# Patient Record
Sex: Female | Born: 1935
Health system: Southern US, Community
[De-identification: ages and names within clinical notes are randomized; demographics above are authoritative.]

## PROBLEM LIST (undated history)

## (undated) DIAGNOSIS — F419 Anxiety disorder, unspecified: Secondary | ICD-10-CM

## (undated) DIAGNOSIS — E041 Nontoxic single thyroid nodule: Secondary | ICD-10-CM

## (undated) DIAGNOSIS — E119 Type 2 diabetes mellitus without complications: Secondary | ICD-10-CM

## (undated) DIAGNOSIS — E785 Hyperlipidemia, unspecified: Secondary | ICD-10-CM

## (undated) DIAGNOSIS — D510 Vitamin B12 deficiency anemia due to intrinsic factor deficiency: Secondary | ICD-10-CM

## (undated) DIAGNOSIS — I10 Essential (primary) hypertension: Secondary | ICD-10-CM

## (undated) DIAGNOSIS — F32A Depression, unspecified: Secondary | ICD-10-CM

## (undated) DIAGNOSIS — F329 Major depressive disorder, single episode, unspecified: Secondary | ICD-10-CM

## (undated) DIAGNOSIS — K5790 Diverticulosis of intestine, part unspecified, without perforation or abscess without bleeding: Secondary | ICD-10-CM

## (undated) DIAGNOSIS — Z8619 Personal history of other infectious and parasitic diseases: Secondary | ICD-10-CM

## (undated) HISTORY — PX: JOINT REPLACEMENT: SHX530

## (undated) HISTORY — DX: Nontoxic single thyroid nodule: E04.1

## (undated) HISTORY — PX: HERNIA REPAIR: SHX51

## (undated) HISTORY — DX: Vitamin B12 deficiency anemia due to intrinsic factor deficiency: D51.0

## (undated) HISTORY — PX: OTHER SURGICAL HISTORY: SHX169

## (undated) HISTORY — DX: Major depressive disorder, single episode, unspecified: F32.9

## (undated) HISTORY — PX: CATARACT EXTRACTION: SUR2

## (undated) HISTORY — DX: Hyperlipidemia, unspecified: E78.5

## (undated) HISTORY — DX: Anxiety disorder, unspecified: F41.9

## (undated) HISTORY — DX: Depression, unspecified: F32.A

## (undated) HISTORY — DX: Diverticulosis of intestine, part unspecified, without perforation or abscess without bleeding: K57.90

## (undated) HISTORY — DX: Type 2 diabetes mellitus without complications: E11.9

## (undated) HISTORY — DX: Essential (primary) hypertension: I10

## (undated) HISTORY — PX: EYE SURGERY: SHX253

## (undated) HISTORY — PX: COLECTOMY: SHX59

## (undated) HISTORY — PX: CARPAL TUNNEL RELEASE: SHX101

## (undated) HISTORY — PX: TONSILLECTOMY: SUR1361

## (undated) HISTORY — PX: CHOLECYSTECTOMY: SHX55

---

## 1996-10-12 HISTORY — PX: OTHER SURGICAL HISTORY: SHX169

## 2001-09-27 ENCOUNTER — Encounter: Admission: RE | Admit: 2001-09-27 | Discharge: 2001-09-27 | Payer: Self-pay | Admitting: Unknown Physician Specialty

## 2001-09-27 ENCOUNTER — Encounter: Payer: Self-pay | Admitting: Unknown Physician Specialty

## 2005-07-27 ENCOUNTER — Ambulatory Visit (HOSPITAL_COMMUNITY): Admission: RE | Admit: 2005-07-27 | Discharge: 2005-07-27 | Payer: Self-pay | Admitting: Orthopedic Surgery

## 2005-07-27 ENCOUNTER — Ambulatory Visit (HOSPITAL_BASED_OUTPATIENT_CLINIC_OR_DEPARTMENT_OTHER): Admission: RE | Admit: 2005-07-27 | Discharge: 2005-07-27 | Payer: Self-pay | Admitting: Orthopedic Surgery

## 2006-05-05 ENCOUNTER — Inpatient Hospital Stay (HOSPITAL_COMMUNITY): Admission: RE | Admit: 2006-05-05 | Discharge: 2006-05-09 | Payer: Self-pay | Admitting: Orthopedic Surgery

## 2012-07-29 ENCOUNTER — Encounter: Payer: Self-pay | Admitting: *Deleted

## 2012-07-29 ENCOUNTER — Ambulatory Visit (INDEPENDENT_AMBULATORY_CARE_PROVIDER_SITE_OTHER): Payer: Medicare Other | Admitting: Cardiovascular Disease

## 2012-07-29 ENCOUNTER — Encounter: Payer: Self-pay | Admitting: Cardiovascular Disease

## 2012-07-29 ENCOUNTER — Telehealth: Payer: Self-pay | Admitting: Cardiovascular Disease

## 2012-07-29 VITALS — BP 116/68 | HR 72 | Ht 62.0 in | Wt 191.2 lb

## 2012-07-29 DIAGNOSIS — R079 Chest pain, unspecified: Secondary | ICD-10-CM

## 2012-07-29 DIAGNOSIS — E785 Hyperlipidemia, unspecified: Secondary | ICD-10-CM

## 2012-07-29 DIAGNOSIS — I10 Essential (primary) hypertension: Secondary | ICD-10-CM

## 2012-07-29 LAB — BASIC METABOLIC PANEL
GFR: 49.8 mL/min — ABNORMAL LOW (ref >60.00)
Glucose, Bld: 124 mg/dL — ABNORMAL HIGH (ref 70–99)
Potassium: 3.4 mEq/L — ABNORMAL LOW (ref 3.5–5.1)
Sodium: 134 mEq/L — ABNORMAL LOW (ref 135–145)

## 2012-07-29 LAB — CBC WITH DIFFERENTIAL/PLATELET
Basophils Absolute: 0 10*3/uL (ref 0.0–0.1)
Eosinophils Relative: 6 % — ABNORMAL HIGH (ref 0.0–5.0)
Hemoglobin: 12.6 g/dL (ref 12.0–15.0)
Lymphocytes Relative: 8.3 % — ABNORMAL LOW (ref 12.0–46.0)
Monocytes Relative: 7.4 % (ref 3.0–12.0)
Neutro Abs: 4.4 10*3/uL (ref 1.4–7.7)
RBC: 4.13 Mil/uL (ref 3.87–5.11)
RDW: 13.1 % (ref 11.5–14.6)
WBC: 5.6 10*3/uL (ref 4.5–10.5)

## 2012-07-29 MED ORDER — PREDNISONE 20 MG PO TABS: ORAL_TABLET | ORAL | Status: DC

## 2012-07-29 NOTE — Progress Notes (Signed)
History of Present Illness: 77 yo female with history of DM, HTN, HLD, anxiety, diverticulosis, pernicious anemia, depression, thyroid nodule who is here today for new patient evaluation. She tells me that she was in her normal state of good health until August 2013. She was awakened with central chest pain radiating to her back on August 6th. She was taken to the ED and was given NTG and this helped her pain. The next day, she had a nuclear stress test in [facility] and it was reported as normal. The chest pain resolved but recurred in September 2013. The pain was worsened with deep inspiration. She went back to the ED at [facility]. She had no EKG changes and was sent home. Seen by Dr. Sudie Bailey in primary care 1016/13 for recurrence of chest pain and EKG was unchanged. She was given NTG to use as needed and used this once yesterday.  She was found to have a UTI this week and has been put on Bactrim.   Primary Care Physician: Philemon Kingdom  Past Medical History  Diagnosis Date  . Diabetes mellitus   . HTN (hypertension)   . Hyperlipidemia   . Thyroid nodule   . Anxiety   . Depression   . Diverticulosis   . Pernicious anemia     Past Surgical History  Procedure Date  . Hernia repair     x 2  . Colectomy   . Cataract extraction     bilateral  . Tonsillectomy     Current Outpatient Prescriptions  Medication Sig Dispense Refill  . ALPRAZolam (XANAX) 0.5 MG tablet Take 0.5 mg by mouth as needed.      Marland Kitchen aspirin 81 MG tablet Take 81 mg by mouth daily.      Marland Kitchen atenolol-chlorthalidone (TENORETIC) 50-25 MG per tablet Take 1 tablet by mouth daily.      . metFORMIN (GLUCOPHAGE) 500 MG tablet Take 500 mg by mouth 3 (three) times daily with meals.      . nitroGLYCERIN (NITROSTAT) 0.4 MG SL tablet Place 0.4 mg under the tongue every 5 (five) minutes as needed.      . pravastatin (PRAVACHOL) 10 MG tablet Take 10 mg by mouth daily.      . ranitidine (ZANTAC) 150 MG tablet Take 150 mg by  mouth 2 (two) times daily.      Marland Kitchen sulfamethoxazole-trimethoprim (BACTRIM DS,SEPTRA DS) 800-160 MG per tablet Take 1 tablet by mouth 2 (two) times daily.        Allergies  Allergen Reactions  . Contrast Media (Iodinated Diagnostic Agents)   . Shellfish Allergy     History   Social History  . Marital Status: Married    Spouse Name: N/A    Number of Children: 2  . Years of Education: N/A   Occupational History  . Homemaker    Social History Main Topics  . Smoking status: Never Smoker   . Smokeless tobacco: Not on file  . Alcohol Use: No  . Drug Use: No  . Sexually Active: Not on file   Other Topics Concern  . Not on file   Social History Narrative  . No narrative on file    Family History  Problem Relation Age of Onset  . Cancer Father     prostate    Review of Systems:  As stated in the HPI and otherwise negative.   BP 116/68  Pulse 72  Ht 5\' 2"  (1.575 m)  Wt 191 lb 3.2 oz (86.728  kg)  BMI 34.97 kg/m2  Physical Examination: General: Well developed, well nourished, NAD HEENT: OP clear, mucus membranes moist SKIN: warm, dry. No rashes. Neuro: No focal deficits Musculoskeletal: Muscle strength 5/5 all ext Psychiatric: Mood and affect normal Neck: No JVD, no carotid bruits, no thyromegaly, no lymphadenopathy. Lungs:Clear bilaterally, no wheezes, rhonci, crackles Cardiovascular: Regular rate and rhythm. No murmurs, gallops or rubs. Abdomen:Soft. Bowel sounds present. Non-tender.  Extremities: No lower extremity edema. Pulses are 2 + in the bilateral DP/PT.  EKG: Sinus, 1st degree AV block. ST depression anterolateral leads. T wave inversions lateral, inferior and anterolateral leads. Poor R wave progression.   Assessment and Plan:   1. Chest pain: She has had episodes of chest pain every few weeks over the last 2 months. She has risk factors for CAD including DM, HTN, HLD, obesity and age. She had a normal stress myoview at [facility] in August 2013  but has continued to have episodes of chest pain. I have discussed options at this point including cardiac cath or coronary CT. I think the cardiac cath is indicated to exclude obstructive CAD. Will arrange for 08/03/12 at [facility] in outpatient cath lab. Labs today. Risks and benefits reviewed with pt. Will need pre-treatment for dye allergy.

## 2012-07-29 NOTE — Telephone Encounter (Signed)
Spoke with pt. Her grandson's name is Alean Kromer and he brought her to appt today with Dr. Clifton James. I confirmed fax number below with pt.  Will send note. Pt is being treated for UTI. I told her she could take tylenol for elevated temperature and that she should follow label instructions on how to take.

## 2012-07-29 NOTE — Patient Instructions (Signed)
Your physician recommends that you schedule a follow-up appointment in:  4 weeks.   Your physician has requested that you have a cardiac catheterization. Cardiac catheterization is used to diagnose and/or treat various heart conditions. Doctors may recommend this procedure for a number of different reasons. The most common reason is to evaluate chest pain. Chest pain can be a symptom of coronary artery disease (CAD), and cardiac catheterization can show whether plaque is narrowing or blocking your heart's arteries. This procedure is also used to evaluate the valves, as well as measure the blood flow and oxygen levels in different parts of your heart. For further information please visit [email or web address] Please follow instruction sheet, as given. Scheduled for August 03, 2012 at [facility]

## 2012-07-29 NOTE — Telephone Encounter (Signed)
I called pt to get grandson's name. No answer on number listed. Will try again to reach pt

## 2012-07-29 NOTE — Telephone Encounter (Signed)
New Problem:    Patient called in wanting you to fax an abscence excuse into her grandson's [employer] [phone] for todays visit.

## 2012-07-29 NOTE — Telephone Encounter (Signed)
Pt has a temp of 100.6 what can she take?

## 2012-08-01 ENCOUNTER — Telehealth: Payer: Self-pay | Admitting: Cardiovascular Disease

## 2012-08-01 NOTE — Telephone Encounter (Signed)
Spoke with pt and told her letter would be written today asking school to excuse grandson, Shawnie Nicole, from school on October 23,2013.

## 2012-08-01 NOTE — Telephone Encounter (Signed)
Note faxed.

## 2012-08-01 NOTE — Telephone Encounter (Signed)
New problem:  Need a note for grandson for school for upcoming cath procedure  - fax # [phone].

## 2012-08-03 ENCOUNTER — Inpatient Hospital Stay (HOSPITAL_BASED_OUTPATIENT_CLINIC_OR_DEPARTMENT_OTHER)
Admission: RE | Admit: 2012-08-03 | Discharge: 2012-08-03 | Disposition: A | Discharge status: Home / Self Care | Payer: Medicare Other | Source: Ambulatory Visit | Attending: Cardiovascular Disease | Admitting: Cardiovascular Disease

## 2012-08-03 ENCOUNTER — Encounter (HOSPITAL_BASED_OUTPATIENT_CLINIC_OR_DEPARTMENT_OTHER)
Admission: RE | Disposition: A | Discharge status: Home / Self Care | Payer: Self-pay | Source: Ambulatory Visit | Attending: Cardiovascular Disease

## 2012-08-03 DIAGNOSIS — R079 Chest pain, unspecified: Secondary | ICD-10-CM

## 2012-08-03 DIAGNOSIS — E119 Type 2 diabetes mellitus without complications: Secondary | ICD-10-CM | POA: Insufficient documentation

## 2012-08-03 DIAGNOSIS — R0789 Other chest pain: Secondary | ICD-10-CM | POA: Insufficient documentation

## 2012-08-03 DIAGNOSIS — E785 Hyperlipidemia, unspecified: Secondary | ICD-10-CM | POA: Insufficient documentation

## 2012-08-03 DIAGNOSIS — F411 Generalized anxiety disorder: Secondary | ICD-10-CM | POA: Insufficient documentation

## 2012-08-03 DIAGNOSIS — F3289 Other specified depressive episodes: Secondary | ICD-10-CM | POA: Insufficient documentation

## 2012-08-03 DIAGNOSIS — F329 Major depressive disorder, single episode, unspecified: Secondary | ICD-10-CM | POA: Insufficient documentation

## 2012-08-03 DIAGNOSIS — I10 Essential (primary) hypertension: Secondary | ICD-10-CM | POA: Insufficient documentation

## 2012-08-03 SURGERY — JV LEFT HEART CATHETERIZATION WITH CORONARY ANGIOGRAM
Anesthesia: Moderate Sedation

## 2012-08-03 MED ORDER — SODIUM CHLORIDE 0.9 % IV SOLN
INTRAVENOUS | Status: DC
  Administered 2012-08-03: 09:00:00 via INTRAVENOUS

## 2012-08-03 MED ORDER — SODIUM CHLORIDE 0.9 % IV SOLN: INTRAVENOUS | Status: AC

## 2012-08-03 MED ORDER — ACETAMINOPHEN 325 MG PO TABS: 650.0000 mg | ORAL_TABLET | ORAL | Status: DC | PRN

## 2012-08-03 MED ORDER — ASPIRIN 81 MG PO CHEW
324.0000 mg | CHEWABLE_TABLET | ORAL | Status: AC
  Administered 2012-08-03: 324 mg via ORAL

## 2012-08-03 MED ORDER — SODIUM CHLORIDE 0.9 % IJ SOLN: 3.0000 mL | INTRAMUSCULAR | Status: DC | PRN

## 2012-08-03 MED ORDER — SODIUM CHLORIDE 0.9 % IV SOLN: 250.0000 mL | INTRAVENOUS | Status: DC | PRN

## 2012-08-03 MED ORDER — FAMOTIDINE IN NACL 20-0.9 MG/50ML-% IV SOLN
20.0000 mg | INTRAVENOUS | Status: AC
  Administered 2012-08-03: 20 mg via INTRAVENOUS

## 2012-08-03 MED ORDER — DIPHENHYDRAMINE HCL 50 MG/ML IJ SOLN
25.0000 mg | INTRAMUSCULAR | Status: AC
  Administered 2012-08-03: 25 mg via INTRAVENOUS

## 2012-08-03 MED ORDER — DIAZEPAM 5 MG PO TABS
5.0000 mg | ORAL_TABLET | ORAL | Status: AC
  Administered 2012-08-03: 5 mg via ORAL

## 2012-08-03 MED ORDER — SODIUM CHLORIDE 0.9 % IJ SOLN: 3.0000 mL | Freq: Two times a day (BID) | INTRAMUSCULAR | Status: DC

## 2012-08-03 NOTE — CV Procedure (Signed)
   Cardiac Catheterization Operative Report  CLARITZA JULY [id number] 10/23/201311:01 AM PROCHNAU,CAROLINE, MD  Procedure Performed:  1. Left Heart Catheterization 2. Selective Coronary Angiography 3. Left ventricular angiogram  Operator: Verne Carrow, MD  Indication: Chest pain concerning for unstable angina with multiple cardiac risk factors.                                      Procedure Details: The risks, benefits, complications, treatment options, and expected outcomes were discussed with the patient. The patient and/or family concurred with the proposed plan, giving informed consent. The patient was brought to the cath lab after IV hydration was begun and oral premedication was given. The patient was further sedated with Versed. The right groin was prepped and draped in the usual manner. Using the modified Seldinger access technique, a 4 French sheath was placed in the right femoral artery. Standard diagnostic catheters were used to perform selective coronary angiography. A pigtail catheter was used to perform a left ventricular angiogram.  There were no immediate complications. The patient was taken to the recovery area in stable condition.   Hemodynamic Findings: Central aortic pressure: 130/54 Left ventricular pressure: 130/12/18  Angiographic Findings:  Left main: No obstructive disease.   Left Anterior Descending Artery: Moderate caliber vessel that courses to the apex. No obstructive disease noted. Small caliber diagonal branch.   Circumflex Artery: Moderate caliber vessel with no obstructive disease noted.   Right Coronary Artery: Large dominant vessel with no obstructive disease noted.   Left Ventricular Angiogram: LVEF 55-60%.   Impression: 1. No angiographic evidence of CAD 2. Normal LV systolic function 3. Non-cardiac chest pain  Recommendations: No further cardiac workup.        Complications:  None. The patient tolerated the procedure well.

## 2012-08-03 NOTE — H&P (View-Only) (Signed)
 History of Present Illness: 77 yo female with history of DM, HTN, HLD, anxiety, diverticulosis, pernicious anemia, depression, thyroid nodule who is here today for new patient evaluation. She tells me that she was in her normal state of good health until August 2013. She was awakened with central chest pain radiating to her back on August 6th. She was taken to the ED and was given NTG and this helped her pain. The next day, she had a nuclear stress test in [facility] and it was reported as normal. The chest pain resolved but recurred in September 2013. The pain was worsened with deep inspiration. She went back to the ED at [place]. She had no EKG changes and was sent home. Seen by Dr. Prochnau in primary care 1016/13 for recurrence of chest pain and EKG was unchanged. She was given NTG to use as needed and used this once yesterday.  She was found to have a UTI this week and has been put on Bactrim.   Primary Care Physician: Caroline Prochnau  Past Medical History  Diagnosis Date  . Diabetes mellitus   . HTN (hypertension)   . Hyperlipidemia   . Thyroid nodule   . Anxiety   . Depression   . Diverticulosis   . Pernicious anemia     Past Surgical History  Procedure Date  . Hernia repair     x 2  . Colectomy   . Cataract extraction     bilateral  . Tonsillectomy     Current Outpatient Prescriptions  Medication Sig Dispense Refill  . ALPRAZolam (XANAX) 0.5 MG tablet Take 0.5 mg by mouth as needed.      . aspirin 81 MG tablet Take 81 mg by mouth daily.      . atenolol-chlorthalidone (TENORETIC) 50-25 MG per tablet Take 1 tablet by mouth daily.      . metFORMIN (GLUCOPHAGE) 500 MG tablet Take 500 mg by mouth 3 (three) times daily with meals.      . nitroGLYCERIN (NITROSTAT) 0.4 MG SL tablet Place 0.4 mg under the tongue every 5 (five) minutes as needed.      . pravastatin (PRAVACHOL) 10 MG tablet Take 10 mg by mouth daily.      . ranitidine (ZANTAC) 150 MG tablet Take 150 mg by  mouth 2 (two) times daily.      . sulfamethoxazole-trimethoprim (BACTRIM DS,SEPTRA DS) 800-160 MG per tablet Take 1 tablet by mouth 2 (two) times daily.        Allergies  Allergen Reactions  . Contrast Media (Iodinated Diagnostic Agents)   . Shellfish Allergy     History   Social History  . Marital Status: Married    Spouse Name: N/A    Number of Children: 2  . Years of Education: N/A   Occupational History  . Homemaker    Social History Main Topics  . Smoking status: Never Smoker   . Smokeless tobacco: Not on file  . Alcohol Use: No  . Drug Use: No  . Sexually Active: Not on file   Other Topics Concern  . Not on file   Social History Narrative  . No narrative on file    Family History  Problem Relation Age of Onset  . Cancer Father     prostate    Review of Systems:  As stated in the HPI and otherwise negative.   BP 116/68  Pulse 72  Ht 5' 2" (1.575 m)  Wt 191 lb 3.2 oz (86.728   kg)  BMI 34.97 kg/m2  Physical Examination: General: Well developed, well nourished, NAD HEENT: OP clear, mucus membranes moist SKIN: warm, dry. No rashes. Neuro: No focal deficits Musculoskeletal: Muscle strength 5/5 all ext Psychiatric: Mood and affect normal Neck: No JVD, no carotid bruits, no thyromegaly, no lymphadenopathy. Lungs:Clear bilaterally, no wheezes, rhonci, crackles Cardiovascular: Regular rate and rhythm. No murmurs, gallops or rubs. Abdomen:Soft. Bowel sounds present. Non-tender.  Extremities: No lower extremity edema. Pulses are 2 + in the bilateral DP/PT.  EKG: Sinus, 1st degree AV block. ST depression anterolateral leads. T wave inversions lateral, inferior and anterolateral leads. Poor R wave progression.   Assessment and Plan:   1. Chest pain: She has had episodes of chest pain every few weeks over the last 2 months. She has risk factors for CAD including DM, HTN, HLD, obesity and age. She had a normal stress myoview at [facility] in August 2013  but has continued to have episodes of chest pain. I have discussed options at this point including cardiac cath or coronary CT. I think the cardiac cath is indicated to exclude obstructive CAD. Will arrange for 08/03/12 at [facility] in outpatient cath lab. Labs today. Risks and benefits reviewed with pt. Will need pre-treatment for dye allergy.  

## 2012-08-03 NOTE — Interval H&P Note (Signed)
History and Physical Interval Note:  08/03/2012 10:11 AM  Drucie Ip  has presented today for cardiac cath with the diagnosis of cp.  The various methods of treatment have been discussed with the patient and family. After consideration of risks, benefits and other options for treatment, the patient has consented to  Procedure(s) (LRB) with comments: JV LEFT HEART CATHETERIZATION WITH CORONARY ANGIOGRAM (N/A) as a surgical intervention .  The patient's history has been reviewed, patient examined, no change in status, stable for surgery.  I have reviewed the patient's chart and labs.  Questions were answered to the patient's satisfaction.     Andrea Gordon

## 2012-08-03 NOTE — Progress Notes (Signed)
Bedrest begins @ 1115.  Tegaderm dressing applied by Venda Rodes, site level 0.

## 2012-08-04 ENCOUNTER — Encounter (HOSPITAL_BASED_OUTPATIENT_CLINIC_OR_DEPARTMENT_OTHER): Payer: Self-pay

## 2012-09-02 ENCOUNTER — Ambulatory Visit: Payer: Medicare Other | Admitting: Cardiovascular Disease

## 2014-10-24 DIAGNOSIS — G894 Chronic pain syndrome: Secondary | ICD-10-CM | POA: Diagnosis not present

## 2014-10-24 DIAGNOSIS — M19042 Primary osteoarthritis, left hand: Secondary | ICD-10-CM | POA: Diagnosis not present

## 2014-11-22 DIAGNOSIS — H02823 Cysts of right eye, unspecified eyelid: Secondary | ICD-10-CM | POA: Diagnosis not present

## 2014-11-22 DIAGNOSIS — L570 Actinic keratosis: Secondary | ICD-10-CM | POA: Diagnosis not present

## 2014-12-26 DIAGNOSIS — E785 Hyperlipidemia, unspecified: Secondary | ICD-10-CM | POA: Diagnosis not present

## 2014-12-26 DIAGNOSIS — N390 Urinary tract infection, site not specified: Secondary | ICD-10-CM | POA: Diagnosis not present

## 2014-12-26 DIAGNOSIS — I10 Essential (primary) hypertension: Secondary | ICD-10-CM | POA: Diagnosis not present

## 2014-12-26 DIAGNOSIS — E119 Type 2 diabetes mellitus without complications: Secondary | ICD-10-CM | POA: Diagnosis not present

## 2014-12-26 DIAGNOSIS — Z79899 Other long term (current) drug therapy: Secondary | ICD-10-CM | POA: Diagnosis not present

## 2015-02-26 DIAGNOSIS — M189 Osteoarthritis of first carpometacarpal joint, unspecified: Secondary | ICD-10-CM | POA: Diagnosis not present

## 2015-03-04 DIAGNOSIS — M1812 Unilateral primary osteoarthritis of first carpometacarpal joint, left hand: Secondary | ICD-10-CM | POA: Diagnosis not present

## 2015-03-04 DIAGNOSIS — E785 Hyperlipidemia, unspecified: Secondary | ICD-10-CM | POA: Diagnosis not present

## 2015-03-04 DIAGNOSIS — E119 Type 2 diabetes mellitus without complications: Secondary | ICD-10-CM | POA: Diagnosis not present

## 2015-03-04 DIAGNOSIS — I10 Essential (primary) hypertension: Secondary | ICD-10-CM | POA: Diagnosis not present

## 2015-03-04 DIAGNOSIS — Z79899 Other long term (current) drug therapy: Secondary | ICD-10-CM | POA: Diagnosis not present

## 2015-03-04 DIAGNOSIS — Z7982 Long term (current) use of aspirin: Secondary | ICD-10-CM | POA: Diagnosis not present

## 2015-03-04 DIAGNOSIS — M189 Osteoarthritis of first carpometacarpal joint, unspecified: Secondary | ICD-10-CM | POA: Diagnosis not present

## 2015-03-04 DIAGNOSIS — Z794 Long term (current) use of insulin: Secondary | ICD-10-CM | POA: Diagnosis not present

## 2015-03-19 DIAGNOSIS — M189 Osteoarthritis of first carpometacarpal joint, unspecified: Secondary | ICD-10-CM | POA: Diagnosis not present

## 2015-05-23 DIAGNOSIS — I10 Essential (primary) hypertension: Secondary | ICD-10-CM | POA: Diagnosis not present

## 2015-05-23 DIAGNOSIS — Z79899 Other long term (current) drug therapy: Secondary | ICD-10-CM | POA: Diagnosis not present

## 2015-05-23 DIAGNOSIS — F339 Major depressive disorder, recurrent, unspecified: Secondary | ICD-10-CM | POA: Diagnosis not present

## 2015-05-23 DIAGNOSIS — K219 Gastro-esophageal reflux disease without esophagitis: Secondary | ICD-10-CM | POA: Diagnosis not present

## 2015-05-23 DIAGNOSIS — F419 Anxiety disorder, unspecified: Secondary | ICD-10-CM | POA: Diagnosis not present

## 2015-05-23 DIAGNOSIS — E119 Type 2 diabetes mellitus without complications: Secondary | ICD-10-CM | POA: Diagnosis not present

## 2015-05-23 DIAGNOSIS — E785 Hyperlipidemia, unspecified: Secondary | ICD-10-CM | POA: Diagnosis not present

## 2015-05-29 DIAGNOSIS — M189 Osteoarthritis of first carpometacarpal joint, unspecified: Secondary | ICD-10-CM | POA: Diagnosis not present

## 2015-07-09 DIAGNOSIS — Z794 Long term (current) use of insulin: Secondary | ICD-10-CM | POA: Diagnosis not present

## 2015-07-09 DIAGNOSIS — K625 Hemorrhage of anus and rectum: Secondary | ICD-10-CM | POA: Diagnosis not present

## 2015-07-09 DIAGNOSIS — I10 Essential (primary) hypertension: Secondary | ICD-10-CM | POA: Diagnosis not present

## 2015-07-09 DIAGNOSIS — E785 Hyperlipidemia, unspecified: Secondary | ICD-10-CM | POA: Diagnosis not present

## 2015-07-09 DIAGNOSIS — K219 Gastro-esophageal reflux disease without esophagitis: Secondary | ICD-10-CM | POA: Diagnosis not present

## 2015-07-09 DIAGNOSIS — E1165 Type 2 diabetes mellitus with hyperglycemia: Secondary | ICD-10-CM | POA: Diagnosis not present

## 2015-07-09 DIAGNOSIS — Z79899 Other long term (current) drug therapy: Secondary | ICD-10-CM | POA: Diagnosis not present

## 2015-07-09 DIAGNOSIS — R002 Palpitations: Secondary | ICD-10-CM | POA: Diagnosis not present

## 2015-11-12 DIAGNOSIS — I10 Essential (primary) hypertension: Secondary | ICD-10-CM | POA: Diagnosis not present

## 2015-11-12 DIAGNOSIS — K219 Gastro-esophageal reflux disease without esophagitis: Secondary | ICD-10-CM | POA: Diagnosis not present

## 2015-11-12 DIAGNOSIS — F419 Anxiety disorder, unspecified: Secondary | ICD-10-CM | POA: Diagnosis not present

## 2015-11-12 DIAGNOSIS — R209 Unspecified disturbances of skin sensation: Secondary | ICD-10-CM | POA: Diagnosis not present

## 2015-11-12 DIAGNOSIS — E785 Hyperlipidemia, unspecified: Secondary | ICD-10-CM | POA: Diagnosis not present

## 2015-11-12 DIAGNOSIS — Z79899 Other long term (current) drug therapy: Secondary | ICD-10-CM | POA: Diagnosis not present

## 2015-11-12 DIAGNOSIS — E1165 Type 2 diabetes mellitus with hyperglycemia: Secondary | ICD-10-CM | POA: Diagnosis not present

## 2015-11-22 DIAGNOSIS — G5600 Carpal tunnel syndrome, unspecified upper limb: Secondary | ICD-10-CM | POA: Diagnosis not present

## 2015-12-19 DIAGNOSIS — G5603 Carpal tunnel syndrome, bilateral upper limbs: Secondary | ICD-10-CM | POA: Diagnosis not present

## 2015-12-20 DIAGNOSIS — G5600 Carpal tunnel syndrome, unspecified upper limb: Secondary | ICD-10-CM | POA: Diagnosis not present

## 2016-01-02 DIAGNOSIS — I10 Essential (primary) hypertension: Secondary | ICD-10-CM | POA: Diagnosis not present

## 2016-01-02 DIAGNOSIS — E785 Hyperlipidemia, unspecified: Secondary | ICD-10-CM | POA: Diagnosis not present

## 2016-01-02 DIAGNOSIS — G5601 Carpal tunnel syndrome, right upper limb: Secondary | ICD-10-CM | POA: Diagnosis not present

## 2016-01-02 DIAGNOSIS — K219 Gastro-esophageal reflux disease without esophagitis: Secondary | ICD-10-CM | POA: Diagnosis not present

## 2016-01-02 DIAGNOSIS — Z79899 Other long term (current) drug therapy: Secondary | ICD-10-CM | POA: Diagnosis not present

## 2016-01-02 DIAGNOSIS — F329 Major depressive disorder, single episode, unspecified: Secondary | ICD-10-CM | POA: Diagnosis not present

## 2016-01-02 DIAGNOSIS — F419 Anxiety disorder, unspecified: Secondary | ICD-10-CM | POA: Diagnosis not present

## 2016-01-02 DIAGNOSIS — G5600 Carpal tunnel syndrome, unspecified upper limb: Secondary | ICD-10-CM | POA: Diagnosis not present

## 2016-03-01 DIAGNOSIS — R079 Chest pain, unspecified: Secondary | ICD-10-CM | POA: Diagnosis not present

## 2016-03-01 DIAGNOSIS — R05 Cough: Secondary | ICD-10-CM | POA: Diagnosis not present

## 2016-03-12 DIAGNOSIS — E785 Hyperlipidemia, unspecified: Secondary | ICD-10-CM | POA: Diagnosis not present

## 2016-03-12 DIAGNOSIS — R5383 Other fatigue: Secondary | ICD-10-CM | POA: Diagnosis not present

## 2016-03-12 DIAGNOSIS — Z79899 Other long term (current) drug therapy: Secondary | ICD-10-CM | POA: Diagnosis not present

## 2016-03-12 DIAGNOSIS — F419 Anxiety disorder, unspecified: Secondary | ICD-10-CM | POA: Diagnosis not present

## 2016-03-12 DIAGNOSIS — K219 Gastro-esophageal reflux disease without esophagitis: Secondary | ICD-10-CM | POA: Diagnosis not present

## 2016-03-12 DIAGNOSIS — E119 Type 2 diabetes mellitus without complications: Secondary | ICD-10-CM | POA: Diagnosis not present

## 2016-03-12 DIAGNOSIS — I10 Essential (primary) hypertension: Secondary | ICD-10-CM | POA: Diagnosis not present

## 2016-04-22 DIAGNOSIS — G5600 Carpal tunnel syndrome, unspecified upper limb: Secondary | ICD-10-CM | POA: Diagnosis not present

## 2016-06-24 DIAGNOSIS — I10 Essential (primary) hypertension: Secondary | ICD-10-CM | POA: Diagnosis not present

## 2016-06-24 DIAGNOSIS — Z79899 Other long term (current) drug therapy: Secondary | ICD-10-CM | POA: Diagnosis not present

## 2016-06-24 DIAGNOSIS — E559 Vitamin D deficiency, unspecified: Secondary | ICD-10-CM | POA: Diagnosis not present

## 2016-07-22 DIAGNOSIS — F419 Anxiety disorder, unspecified: Secondary | ICD-10-CM | POA: Diagnosis not present

## 2016-07-22 DIAGNOSIS — E119 Type 2 diabetes mellitus without complications: Secondary | ICD-10-CM | POA: Diagnosis not present

## 2016-07-22 DIAGNOSIS — L659 Nonscarring hair loss, unspecified: Secondary | ICD-10-CM | POA: Diagnosis not present

## 2016-07-22 DIAGNOSIS — Z79899 Other long term (current) drug therapy: Secondary | ICD-10-CM | POA: Diagnosis not present

## 2016-07-22 DIAGNOSIS — K219 Gastro-esophageal reflux disease without esophagitis: Secondary | ICD-10-CM | POA: Diagnosis not present

## 2016-07-22 DIAGNOSIS — I10 Essential (primary) hypertension: Secondary | ICD-10-CM | POA: Diagnosis not present

## 2016-07-22 DIAGNOSIS — E785 Hyperlipidemia, unspecified: Secondary | ICD-10-CM | POA: Diagnosis not present

## 2016-10-23 DIAGNOSIS — R509 Fever, unspecified: Secondary | ICD-10-CM | POA: Diagnosis not present

## 2016-10-23 DIAGNOSIS — J111 Influenza due to unidentified influenza virus with other respiratory manifestations: Secondary | ICD-10-CM | POA: Diagnosis not present

## 2016-11-25 DIAGNOSIS — R05 Cough: Secondary | ICD-10-CM | POA: Diagnosis not present

## 2016-11-25 DIAGNOSIS — E119 Type 2 diabetes mellitus without complications: Secondary | ICD-10-CM | POA: Diagnosis not present

## 2016-11-25 DIAGNOSIS — E559 Vitamin D deficiency, unspecified: Secondary | ICD-10-CM | POA: Diagnosis not present

## 2016-11-25 DIAGNOSIS — K219 Gastro-esophageal reflux disease without esophagitis: Secondary | ICD-10-CM | POA: Diagnosis not present

## 2016-11-25 DIAGNOSIS — F419 Anxiety disorder, unspecified: Secondary | ICD-10-CM | POA: Diagnosis not present

## 2016-11-25 DIAGNOSIS — I10 Essential (primary) hypertension: Secondary | ICD-10-CM | POA: Diagnosis not present

## 2016-11-25 DIAGNOSIS — E785 Hyperlipidemia, unspecified: Secondary | ICD-10-CM | POA: Diagnosis not present

## 2016-11-25 DIAGNOSIS — Z79899 Other long term (current) drug therapy: Secondary | ICD-10-CM | POA: Diagnosis not present

## 2016-11-25 DIAGNOSIS — E049 Nontoxic goiter, unspecified: Secondary | ICD-10-CM | POA: Diagnosis not present

## 2017-03-24 DIAGNOSIS — R1032 Left lower quadrant pain: Secondary | ICD-10-CM | POA: Diagnosis not present

## 2017-03-24 DIAGNOSIS — Z79899 Other long term (current) drug therapy: Secondary | ICD-10-CM | POA: Diagnosis not present

## 2017-03-24 DIAGNOSIS — E785 Hyperlipidemia, unspecified: Secondary | ICD-10-CM | POA: Diagnosis not present

## 2017-03-24 DIAGNOSIS — E049 Nontoxic goiter, unspecified: Secondary | ICD-10-CM | POA: Diagnosis not present

## 2017-03-24 DIAGNOSIS — I10 Essential (primary) hypertension: Secondary | ICD-10-CM | POA: Diagnosis not present

## 2017-03-24 DIAGNOSIS — E559 Vitamin D deficiency, unspecified: Secondary | ICD-10-CM | POA: Diagnosis not present

## 2017-03-24 DIAGNOSIS — E119 Type 2 diabetes mellitus without complications: Secondary | ICD-10-CM | POA: Diagnosis not present

## 2017-05-07 DIAGNOSIS — E049 Nontoxic goiter, unspecified: Secondary | ICD-10-CM | POA: Diagnosis not present

## 2017-05-07 DIAGNOSIS — E042 Nontoxic multinodular goiter: Secondary | ICD-10-CM | POA: Diagnosis not present

## 2017-07-08 DIAGNOSIS — R51 Headache: Secondary | ICD-10-CM | POA: Diagnosis not present

## 2017-07-08 DIAGNOSIS — Z79899 Other long term (current) drug therapy: Secondary | ICD-10-CM | POA: Diagnosis not present

## 2017-07-08 DIAGNOSIS — I10 Essential (primary) hypertension: Secondary | ICD-10-CM | POA: Diagnosis not present

## 2017-07-08 DIAGNOSIS — E785 Hyperlipidemia, unspecified: Secondary | ICD-10-CM | POA: Diagnosis not present

## 2017-07-08 DIAGNOSIS — Z794 Long term (current) use of insulin: Secondary | ICD-10-CM | POA: Diagnosis not present

## 2017-07-08 DIAGNOSIS — M549 Dorsalgia, unspecified: Secondary | ICD-10-CM | POA: Diagnosis not present

## 2017-07-08 DIAGNOSIS — M542 Cervicalgia: Secondary | ICD-10-CM | POA: Diagnosis not present

## 2017-07-08 DIAGNOSIS — E119 Type 2 diabetes mellitus without complications: Secondary | ICD-10-CM | POA: Diagnosis not present

## 2017-08-30 DIAGNOSIS — R531 Weakness: Secondary | ICD-10-CM | POA: Diagnosis not present

## 2017-08-30 DIAGNOSIS — M542 Cervicalgia: Secondary | ICD-10-CM | POA: Diagnosis not present

## 2017-08-30 DIAGNOSIS — R200 Anesthesia of skin: Secondary | ICD-10-CM | POA: Diagnosis not present

## 2017-08-30 DIAGNOSIS — M1990 Unspecified osteoarthritis, unspecified site: Secondary | ICD-10-CM | POA: Diagnosis not present

## 2017-08-30 DIAGNOSIS — R51 Headache: Secondary | ICD-10-CM | POA: Diagnosis not present

## 2017-08-30 DIAGNOSIS — R296 Repeated falls: Secondary | ICD-10-CM | POA: Diagnosis not present

## 2017-10-13 DIAGNOSIS — M79641 Pain in right hand: Secondary | ICD-10-CM | POA: Diagnosis not present

## 2017-10-20 DIAGNOSIS — M5412 Radiculopathy, cervical region: Secondary | ICD-10-CM | POA: Diagnosis not present

## 2017-11-08 DIAGNOSIS — M5412 Radiculopathy, cervical region: Secondary | ICD-10-CM | POA: Diagnosis not present

## 2017-11-08 DIAGNOSIS — M542 Cervicalgia: Secondary | ICD-10-CM | POA: Diagnosis not present

## 2017-11-10 DIAGNOSIS — G5600 Carpal tunnel syndrome, unspecified upper limb: Secondary | ICD-10-CM | POA: Diagnosis not present

## 2017-11-10 DIAGNOSIS — G5601 Carpal tunnel syndrome, right upper limb: Secondary | ICD-10-CM | POA: Diagnosis not present

## 2017-11-10 DIAGNOSIS — M5412 Radiculopathy, cervical region: Secondary | ICD-10-CM | POA: Diagnosis not present

## 2017-11-19 DIAGNOSIS — M50123 Cervical disc disorder at C6-C7 level with radiculopathy: Secondary | ICD-10-CM | POA: Diagnosis not present

## 2017-11-19 DIAGNOSIS — M5412 Radiculopathy, cervical region: Secondary | ICD-10-CM | POA: Diagnosis not present

## 2017-11-19 DIAGNOSIS — M50323 Other cervical disc degeneration at C6-C7 level: Secondary | ICD-10-CM | POA: Diagnosis not present

## 2017-11-24 DIAGNOSIS — E119 Type 2 diabetes mellitus without complications: Secondary | ICD-10-CM | POA: Diagnosis not present

## 2017-11-24 DIAGNOSIS — E785 Hyperlipidemia, unspecified: Secondary | ICD-10-CM | POA: Diagnosis not present

## 2017-11-24 DIAGNOSIS — F419 Anxiety disorder, unspecified: Secondary | ICD-10-CM | POA: Diagnosis not present

## 2017-11-24 DIAGNOSIS — I10 Essential (primary) hypertension: Secondary | ICD-10-CM | POA: Diagnosis not present

## 2017-11-24 DIAGNOSIS — Z79899 Other long term (current) drug therapy: Secondary | ICD-10-CM | POA: Diagnosis not present

## 2017-11-24 DIAGNOSIS — Z794 Long term (current) use of insulin: Secondary | ICD-10-CM | POA: Diagnosis not present

## 2017-11-24 DIAGNOSIS — M4802 Spinal stenosis, cervical region: Secondary | ICD-10-CM | POA: Diagnosis not present

## 2017-12-07 DIAGNOSIS — Z6841 Body Mass Index (BMI) 40.0 and over, adult: Secondary | ICD-10-CM | POA: Diagnosis not present

## 2017-12-07 DIAGNOSIS — M4712 Other spondylosis with myelopathy, cervical region: Secondary | ICD-10-CM | POA: Diagnosis not present

## 2017-12-07 DIAGNOSIS — I10 Essential (primary) hypertension: Secondary | ICD-10-CM | POA: Diagnosis not present

## 2017-12-13 ENCOUNTER — Other Ambulatory Visit: Payer: Self-pay | Admitting: Neurosurgery

## 2017-12-31 ENCOUNTER — Encounter (HOSPITAL_COMMUNITY): Payer: Self-pay

## 2017-12-31 NOTE — Pre-Procedure Instructions (Addendum)
Andrea Gordon  12/31/2017      [facility] - [place], [place] - [address] [place] [address] Phone: [phone] Fax: [phone]  [facility] - [place], [place] - [address] [place] [address] Phone: [phone] Fax: [phone]    Your procedure is scheduled on 01-06-2018 Thursday   Report to [facility] Admitting at 12:25  PM  .  Call this number if you have problems the morning of surgery:  [phone]   Remember:  Do not eat food or drink liquids after midnight.   Take these medicines the morning of surgery with A SIP OF WATER   Tylenol if needed Atenolol-chlorthalidone(Tenoretic) Duloxetine(Cymbalta) Gabapentin(Neurontin) Potassium Chloride(K-Dur) Ranitidine(Zantac)   STOP TAKING ANY ASPIRIN(UNLESS OTHERWISE INSTRUCTED BY YOUR SURGEON),ANTIINFLAMATORIES (IBUPROFEN,ALEVE,MOTRIN,ADVIL,GOODY'S POWDERS),HERBAL SUPPLEMENTS,FISH OIL,AND VITAMINS 5-7 DAYS PRIOR TO SURGERY    Special Instructions: [facility] - Preparing for Surgery  Before surgery, you can play an important role.  Because skin is not sterile, your skin needs to be as free of germs as possible.  You can reduce the number of germs on you skin by washing with CHG (chlorahexidine gluconate) soap before surgery.  CHG is an antiseptic cleaner which kills germs and bonds with the skin to continue killing germs even after washing.  Please DO NOT use if you have an allergy to CHG or antibacterial soaps.  If your skin becomes reddened/irritated stop using the CHG and inform your nurse when you arrive at Short Stay.  Do not shave (including legs and underarms) for at least 48 hours prior to the first CHG shower.  You may shave your face.  Please follow these instructions carefully:   1.  Shower with CHG Soap the night before surgery and the   morning of Surgery.  2.  If you choose to wash your hair, wash your  hair first as usual with your normal shampoo.  3.  After you shampoo, rinse your hair and body thoroughly to remove the  Shampoo.  4.  Use CHG as you would any other liquid soap.  You can apply chg directly  to the skin and wash gently with scrungie or a clean washcloth.  5.  Apply the CHG Soap to your body ONLY FROM THE NECK DOWN.   Do not use on open wounds or open sores.  Avoid contact with your eyes,  ears, mouth and genitals (private parts).  Wash genitals (private parts) with your normal soap.  6.  Wash thoroughly, paying special attention to the area where your surgery will be performed.  7.  Thoroughly rinse your body with warm water from the neck down.  8.  DO NOT shower/wash with your normal soap after using and rinsing o  the CHG Soap.  9.  Pat yourself dry with a clean towel.            10.  Wear clean pajamas.            11.  Place clean sheets on your bed the night of your first shower and do not sleep with pets.  Day of Surgery  Do not apply any lotions/deodorants the morning of surgery.  Please wear clean clothes to the hospital/surgery center.      How to Manage Your Diabetes Before and After Surgery  Why is it important to control my blood sugar before and after surgery? . Improving blood sugar levels before and  after surgery helps healing and can limit problems. . A way of improving blood sugar control is eating a healthy diet by: o  Eating less sugar and carbohydrates o  Increasing activity/exercise o  Talking with your doctor about reaching your blood sugar goals . High blood sugars (greater than 180 mg/dL) can raise your risk of infections and slow your recovery, so you will need to focus on controlling your diabetes during the weeks before surgery. . Make sure that the doctor who takes care of your diabetes knows about your planned surgery including the date and location.  How do I manage my blood sugar before surgery? . Check your blood sugar at least 4 times a  day, starting 2 days before surgery, to make sure that the level is not too high or low. o Check your blood sugar the morning of your surgery when you wake up and every 2 hours until you get to the Short Stay unit. . If your blood sugar is less than 70 mg/dL, you will need to treat for low blood sugar: o Do not take insulin. o Treat a low blood sugar (less than 70 mg/dL) with  cup of clear juice (cranberry or apple), 4 glucose tablets, OR glucose gel. Recheck blood sugar in 15 minutes after treatment (to make sure it is greater than 70 mg/dL). If your blood sugar is not greater than 70 mg/dL on recheck, call [phone] o  for further instructions. . Report your blood sugar to the short stay nurse when you get to Short Stay.  . If you are admitted to the hospital after surgery: o Your blood sugar will be checked by the staff and you will probably be given insulin after surgery (instead of oral diabetes medicines) to make sure you have good blood sugar levels. o The goal for blood sugar control after surgery is 80-180 mg/dL.              WHAT DO I DO ABOUT MY DIABETES MEDICATION?   Marland Kitchen. Do not take oral diabetes medicines (pills) the morning of surgery.  . THE NIGHT BEFORE SURGERY, take ___21 units of _70/30 insulin.       . THE MORNING OF SURGERY, take __0 units of _70/30 insulin  . The day of surgery, do not take other diabetes injectables, including Byetta (exenatide), Bydureon (exenatide ER), Victoza (liraglutide), or Trulicity (dulaglutide).  .  .  Other Instructions:           :    :   Reviewed and Endorsed by [facility] Patient Education Committee, August 2015    Do not wear jewelry, make-up or nail polish.  Do not wear lotions, powders, or perfumes, or deodorant.  Do not shave 48 hours prior to surgery.  Men may shave face and neck.   Do not bring valuables to the hospital.   [facility] is not responsible for any belongings or valuables.  Contacts,  dentures or bridgework may not be worn into surgery.  Leave your suitcase in the car.  After surgery it may be brought to your room.  For patients admitted to the hospital, discharge time will be determined by your treatment team.  Patients discharged the day of surgery will not be allowed to drive home.      Please read over the following fact sheets that you were given. Pain Booklet and Surgical Site Infection Prevention

## 2018-01-03 ENCOUNTER — Encounter (HOSPITAL_COMMUNITY)
Admission: RE | Admit: 2018-01-03 | Discharge: 2018-01-03 | Disposition: A | Discharge status: Home / Self Care | Payer: Medicare HMO | Source: Ambulatory Visit | Attending: Neurosurgery | Admitting: Neurosurgery

## 2018-01-03 ENCOUNTER — Other Ambulatory Visit: Payer: Self-pay

## 2018-01-03 ENCOUNTER — Encounter (HOSPITAL_COMMUNITY): Payer: Self-pay | Admitting: *Deleted

## 2018-01-03 DIAGNOSIS — Z7982 Long term (current) use of aspirin: Secondary | ICD-10-CM | POA: Diagnosis not present

## 2018-01-03 DIAGNOSIS — Z01810 Encounter for preprocedural cardiovascular examination: Secondary | ICD-10-CM

## 2018-01-03 DIAGNOSIS — E119 Type 2 diabetes mellitus without complications: Secondary | ICD-10-CM | POA: Diagnosis present

## 2018-01-03 DIAGNOSIS — Z79899 Other long term (current) drug therapy: Secondary | ICD-10-CM

## 2018-01-03 DIAGNOSIS — E785 Hyperlipidemia, unspecified: Secondary | ICD-10-CM | POA: Diagnosis present

## 2018-01-03 DIAGNOSIS — Z91041 Radiographic dye allergy status: Secondary | ICD-10-CM | POA: Diagnosis not present

## 2018-01-03 DIAGNOSIS — Z794 Long term (current) use of insulin: Secondary | ICD-10-CM | POA: Diagnosis not present

## 2018-01-03 DIAGNOSIS — F419 Anxiety disorder, unspecified: Secondary | ICD-10-CM | POA: Diagnosis present

## 2018-01-03 DIAGNOSIS — F418 Other specified anxiety disorders: Secondary | ICD-10-CM | POA: Diagnosis not present

## 2018-01-03 DIAGNOSIS — M4722 Other spondylosis with radiculopathy, cervical region: Secondary | ICD-10-CM | POA: Diagnosis not present

## 2018-01-03 DIAGNOSIS — Z885 Allergy status to narcotic agent status: Secondary | ICD-10-CM | POA: Diagnosis not present

## 2018-01-03 DIAGNOSIS — Z981 Arthrodesis status: Secondary | ICD-10-CM | POA: Diagnosis not present

## 2018-01-03 DIAGNOSIS — K219 Gastro-esophageal reflux disease without esophagitis: Secondary | ICD-10-CM | POA: Diagnosis present

## 2018-01-03 DIAGNOSIS — Z01818 Encounter for other preprocedural examination: Secondary | ICD-10-CM | POA: Insufficient documentation

## 2018-01-03 DIAGNOSIS — I440 Atrioventricular block, first degree: Secondary | ICD-10-CM

## 2018-01-03 DIAGNOSIS — Z888 Allergy status to other drugs, medicaments and biological substances status: Secondary | ICD-10-CM | POA: Diagnosis not present

## 2018-01-03 DIAGNOSIS — M4802 Spinal stenosis, cervical region: Secondary | ICD-10-CM | POA: Diagnosis present

## 2018-01-03 DIAGNOSIS — I10 Essential (primary) hypertension: Secondary | ICD-10-CM | POA: Diagnosis present

## 2018-01-03 DIAGNOSIS — M4712 Other spondylosis with myelopathy, cervical region: Secondary | ICD-10-CM | POA: Diagnosis present

## 2018-01-03 DIAGNOSIS — M50121 Cervical disc disorder at C4-C5 level with radiculopathy: Secondary | ICD-10-CM | POA: Diagnosis present

## 2018-01-03 DIAGNOSIS — F329 Major depressive disorder, single episode, unspecified: Secondary | ICD-10-CM

## 2018-01-03 DIAGNOSIS — Z91013 Allergy to seafood: Secondary | ICD-10-CM | POA: Diagnosis not present

## 2018-01-03 DIAGNOSIS — M5011 Cervical disc disorder with radiculopathy,  high cervical region: Secondary | ICD-10-CM | POA: Diagnosis not present

## 2018-01-03 DIAGNOSIS — M5001 Cervical disc disorder with myelopathy,  high cervical region: Secondary | ICD-10-CM | POA: Diagnosis not present

## 2018-01-03 DIAGNOSIS — Z8619 Personal history of other infectious and parasitic diseases: Secondary | ICD-10-CM | POA: Diagnosis not present

## 2018-01-03 HISTORY — DX: Personal history of other infectious and parasitic diseases: Z86.19

## 2018-01-03 LAB — GLUCOSE, CAPILLARY: Glucose-Capillary: 77 mg/dL (ref 65–99)

## 2018-01-03 LAB — TYPE AND SCREEN
ABO/RH(D): A POS
ANTIBODY SCREEN: NEGATIVE

## 2018-01-03 LAB — BASIC METABOLIC PANEL
ANION GAP: 9 (ref 5–15)
BUN: 21 mg/dL — ABNORMAL HIGH (ref 6–20)
CHLORIDE: 101 mmol/L (ref 101–111)
CO2: 28 mmol/L (ref 22–32)
CREATININE: 0.84 mg/dL (ref 0.44–1.00)
Calcium: 10.4 mg/dL — ABNORMAL HIGH (ref 8.9–10.3)
GFR calc non Af Amer: >60 mL/min (ref >60)
Glucose, Bld: 63 mg/dL — ABNORMAL LOW (ref 65–99)
POTASSIUM: 3.1 mmol/L — AB (ref 3.5–5.1)
SODIUM: 138 mmol/L (ref 135–145)

## 2018-01-03 LAB — CBC
HCT: 39.3 % (ref 36.0–46.0)
Hemoglobin: 13 g/dL (ref 12.0–15.0)
MCH: 30.5 pg (ref 26.0–34.0)
MCHC: 33.1 g/dL (ref 30.0–36.0)
MCV: 92.3 fL (ref 78.0–100.0)
Platelets: 303 10*3/uL (ref 150–400)
RBC: 4.26 MIL/uL (ref 3.87–5.11)
RDW: 13.1 % (ref 11.5–15.5)
WBC: 7.4 10*3/uL (ref 4.0–10.5)

## 2018-01-03 LAB — SURGICAL PCR SCREEN
MRSA, PCR: NEGATIVE
Staphylococcus aureus: NEGATIVE

## 2018-01-03 LAB — HEMOGLOBIN A1C
Hgb A1c MFr Bld: 6 % — ABNORMAL HIGH (ref 4.8–5.6)
Mean Plasma Glucose: 125.5 mg/dL

## 2018-01-03 NOTE — Progress Notes (Signed)
Pt. States no history of cardiac problems.  Denies ever seeing a cardiologist or having any cardiac testing done.

## 2018-01-06 ENCOUNTER — Inpatient Hospital Stay (HOSPITAL_COMMUNITY)
Admission: RE | Admit: 2018-01-06 | Discharge: 2018-01-09 | DRG: 472 | Disposition: A | Discharge status: Home / Self Care | Payer: Medicare HMO | Source: Ambulatory Visit | Attending: Neurosurgery | Admitting: Neurosurgery

## 2018-01-06 ENCOUNTER — Encounter (HOSPITAL_COMMUNITY): Payer: Self-pay | Admitting: *Deleted

## 2018-01-06 ENCOUNTER — Inpatient Hospital Stay (HOSPITAL_COMMUNITY): Payer: Medicare HMO

## 2018-01-06 ENCOUNTER — Inpatient Hospital Stay (HOSPITAL_COMMUNITY): Payer: Medicare HMO | Admitting: Anesthesiology

## 2018-01-06 ENCOUNTER — Inpatient Hospital Stay (HOSPITAL_COMMUNITY)
Admission: RE | Disposition: A | Discharge status: Home / Self Care | Payer: Self-pay | Source: Ambulatory Visit | Attending: Neurosurgery

## 2018-01-06 DIAGNOSIS — Z8619 Personal history of other infectious and parasitic diseases: Secondary | ICD-10-CM

## 2018-01-06 DIAGNOSIS — F329 Major depressive disorder, single episode, unspecified: Secondary | ICD-10-CM | POA: Diagnosis present

## 2018-01-06 DIAGNOSIS — Z794 Long term (current) use of insulin: Secondary | ICD-10-CM | POA: Diagnosis not present

## 2018-01-06 DIAGNOSIS — F419 Anxiety disorder, unspecified: Secondary | ICD-10-CM | POA: Diagnosis present

## 2018-01-06 DIAGNOSIS — M50121 Cervical disc disorder at C4-C5 level with radiculopathy: Secondary | ICD-10-CM | POA: Diagnosis present

## 2018-01-06 DIAGNOSIS — Z7982 Long term (current) use of aspirin: Secondary | ICD-10-CM | POA: Diagnosis not present

## 2018-01-06 DIAGNOSIS — E785 Hyperlipidemia, unspecified: Secondary | ICD-10-CM | POA: Diagnosis present

## 2018-01-06 DIAGNOSIS — Z885 Allergy status to narcotic agent status: Secondary | ICD-10-CM

## 2018-01-06 DIAGNOSIS — Z419 Encounter for procedure for purposes other than remedying health state, unspecified: Secondary | ICD-10-CM

## 2018-01-06 DIAGNOSIS — G992 Myelopathy in diseases classified elsewhere: Secondary | ICD-10-CM | POA: Diagnosis present

## 2018-01-06 DIAGNOSIS — E119 Type 2 diabetes mellitus without complications: Secondary | ICD-10-CM | POA: Diagnosis present

## 2018-01-06 DIAGNOSIS — Z79899 Other long term (current) drug therapy: Secondary | ICD-10-CM

## 2018-01-06 DIAGNOSIS — K219 Gastro-esophageal reflux disease without esophagitis: Secondary | ICD-10-CM | POA: Diagnosis present

## 2018-01-06 DIAGNOSIS — Z981 Arthrodesis status: Secondary | ICD-10-CM | POA: Diagnosis not present

## 2018-01-06 DIAGNOSIS — Z888 Allergy status to other drugs, medicaments and biological substances status: Secondary | ICD-10-CM

## 2018-01-06 DIAGNOSIS — I10 Essential (primary) hypertension: Secondary | ICD-10-CM | POA: Diagnosis present

## 2018-01-06 DIAGNOSIS — Z91041 Radiographic dye allergy status: Secondary | ICD-10-CM

## 2018-01-06 DIAGNOSIS — M4712 Other spondylosis with myelopathy, cervical region: Secondary | ICD-10-CM | POA: Diagnosis present

## 2018-01-06 DIAGNOSIS — M4802 Spinal stenosis, cervical region: Principal | ICD-10-CM | POA: Diagnosis present

## 2018-01-06 DIAGNOSIS — Z91013 Allergy to seafood: Secondary | ICD-10-CM | POA: Diagnosis not present

## 2018-01-06 HISTORY — PX: ANTERIOR CERVICAL DISCECTOMY: SHX1160

## 2018-01-06 HISTORY — PX: ANTERIOR CERVICAL DECOMP/DISCECTOMY FUSION: SHX1161

## 2018-01-06 LAB — GLUCOSE, CAPILLARY
GLUCOSE-CAPILLARY: 157 mg/dL — AB (ref 65–99)
GLUCOSE-CAPILLARY: 171 mg/dL — AB (ref 65–99)
Glucose-Capillary: 129 mg/dL — ABNORMAL HIGH (ref 65–99)
Glucose-Capillary: 205 mg/dL — ABNORMAL HIGH (ref 65–99)

## 2018-01-06 LAB — HEMOGLOBIN A1C
HEMOGLOBIN A1C: 5.9 % — AB (ref 4.8–5.6)
MEAN PLASMA GLUCOSE: 122.63 mg/dL

## 2018-01-06 SURGERY — ANTERIOR CERVICAL DECOMPRESSION/DISCECTOMY FUSION 2 LEVELS
Anesthesia: General | Site: Spine Cervical

## 2018-01-06 MED ORDER — THROMBIN 5000 UNITS EX SOLR
CUTANEOUS | Status: AC
  Filled 2018-01-06: qty 5000

## 2018-01-06 MED ORDER — OXYCODONE HCL 5 MG/5ML PO SOLN: 5.0000 mg | Freq: Once | ORAL | Status: DC | PRN

## 2018-01-06 MED ORDER — MENTHOL 3 MG MT LOZG: 1.0000 | LOZENGE | OROMUCOSAL | Status: DC | PRN

## 2018-01-06 MED ORDER — ONDANSETRON HCL 4 MG/2ML IJ SOLN: 4.0000 mg | Freq: Four times a day (QID) | INTRAMUSCULAR | Status: DC | PRN

## 2018-01-06 MED ORDER — DEXAMETHASONE SODIUM PHOSPHATE 4 MG/ML IJ SOLN
2.0000 mg | Freq: Four times a day (QID) | INTRAMUSCULAR | Status: AC
  Administered 2018-01-06 (×2): 2 mg via INTRAVENOUS
  Filled 2018-01-06 (×2): qty 1

## 2018-01-06 MED ORDER — THROMBIN (RECOMBINANT) 5000 UNITS EX SOLR
OROMUCOSAL | Status: DC | PRN
  Administered 2018-01-06: 5 mL via TOPICAL

## 2018-01-06 MED ORDER — PROPOFOL 10 MG/ML IV BOLUS
INTRAVENOUS | Status: AC
  Filled 2018-01-06: qty 20

## 2018-01-06 MED ORDER — POTASSIUM CHLORIDE CRYS ER 10 MEQ PO TBCR
10.0000 meq | EXTENDED_RELEASE_TABLET | Freq: Every day | ORAL | Status: DC
  Administered 2018-01-07 – 2018-01-09 (×3): 10 meq via ORAL
  Filled 2018-01-06 (×4): qty 1

## 2018-01-06 MED ORDER — INSULIN ASPART 100 UNIT/ML ~~LOC~~ SOLN
0.0000 [IU] | SUBCUTANEOUS | Status: DC
  Administered 2018-01-06: 7 [IU] via SUBCUTANEOUS
  Administered 2018-01-07: 4 [IU] via SUBCUTANEOUS
  Administered 2018-01-07: 7 [IU] via SUBCUTANEOUS
  Administered 2018-01-07 (×2): 4 [IU] via SUBCUTANEOUS
  Administered 2018-01-07: 3 [IU] via SUBCUTANEOUS
  Administered 2018-01-08: 4 [IU] via SUBCUTANEOUS
  Administered 2018-01-08: 3 [IU] via SUBCUTANEOUS

## 2018-01-06 MED ORDER — FENTANYL CITRATE (PF) 250 MCG/5ML IJ SOLN
INTRAMUSCULAR | Status: AC
  Filled 2018-01-06: qty 5

## 2018-01-06 MED ORDER — VITAMIN D (ERGOCALCIFEROL) 1.25 MG (50000 UNIT) PO CAPS
50000.0000 [IU] | ORAL_CAPSULE | ORAL | Status: DC
  Administered 2018-01-07: 50000 [IU] via ORAL
  Filled 2018-01-06: qty 1

## 2018-01-06 MED ORDER — ARTIFICIAL TEARS OPHTHALMIC OINT
TOPICAL_OINTMENT | OPHTHALMIC | Status: AC
  Filled 2018-01-06: qty 3.5

## 2018-01-06 MED ORDER — GABAPENTIN 100 MG PO CAPS
100.0000 mg | ORAL_CAPSULE | Freq: Three times a day (TID) | ORAL | Status: DC
  Administered 2018-01-06 – 2018-01-09 (×9): 100 mg via ORAL
  Filled 2018-01-06 (×9): qty 1

## 2018-01-06 MED ORDER — LACTATED RINGERS IV SOLN
INTRAVENOUS | Status: DC
  Administered 2018-01-06 – 2018-01-07 (×3): via INTRAVENOUS

## 2018-01-06 MED ORDER — ONDANSETRON HCL 4 MG PO TABS: 4.0000 mg | ORAL_TABLET | Freq: Four times a day (QID) | ORAL | Status: DC | PRN

## 2018-01-06 MED ORDER — BACITRACIN ZINC 500 UNIT/GM EX OINT
TOPICAL_OINTMENT | CUTANEOUS | Status: AC
  Filled 2018-01-06: qty 28.35

## 2018-01-06 MED ORDER — CEFAZOLIN SODIUM-DEXTROSE 2-4 GM/100ML-% IV SOLN
INTRAVENOUS | Status: AC
  Filled 2018-01-06: qty 100

## 2018-01-06 MED ORDER — 0.9 % SODIUM CHLORIDE (POUR BTL) OPTIME
TOPICAL | Status: DC | PRN
  Administered 2018-01-06: 1000 mL

## 2018-01-06 MED ORDER — PANTOPRAZOLE SODIUM 40 MG IV SOLR
40.0000 mg | Freq: Every day | INTRAVENOUS | Status: DC
  Administered 2018-01-06: 40 mg via INTRAVENOUS
  Filled 2018-01-06: qty 40

## 2018-01-06 MED ORDER — FENTANYL CITRATE (PF) 100 MCG/2ML IJ SOLN
INTRAMUSCULAR | Status: AC
  Administered 2018-01-06: 50 ug via INTRAVENOUS
  Filled 2018-01-06: qty 2

## 2018-01-06 MED ORDER — PHENOL 1.4 % MT LIQD
1.0000 | OROMUCOSAL | Status: DC | PRN
  Administered 2018-01-07: 1 via OROMUCOSAL
  Filled 2018-01-06: qty 177

## 2018-01-06 MED ORDER — FENTANYL CITRATE (PF) 100 MCG/2ML IJ SOLN
25.0000 ug | INTRAMUSCULAR | Status: DC | PRN
  Administered 2018-01-06 (×2): 50 ug via INTRAVENOUS

## 2018-01-06 MED ORDER — EPHEDRINE SULFATE-NACL 50-0.9 MG/10ML-% IV SOSY
PREFILLED_SYRINGE | INTRAVENOUS | Status: DC | PRN
  Administered 2018-01-06: 10 mg via INTRAVENOUS
  Administered 2018-01-06 (×3): 5 mg via INTRAVENOUS

## 2018-01-06 MED ORDER — ONDANSETRON HCL 4 MG/2ML IJ SOLN
INTRAMUSCULAR | Status: AC
  Filled 2018-01-06: qty 4

## 2018-01-06 MED ORDER — CHLORTHALIDONE 25 MG PO TABS
25.0000 mg | ORAL_TABLET | Freq: Every day | ORAL | Status: DC
  Administered 2018-01-07 – 2018-01-09 (×3): 25 mg via ORAL
  Filled 2018-01-06 (×3): qty 1

## 2018-01-06 MED ORDER — ROCURONIUM BROMIDE 10 MG/ML (PF) SYRINGE
PREFILLED_SYRINGE | INTRAVENOUS | Status: DC | PRN
  Administered 2018-01-06: 50 mg via INTRAVENOUS
  Administered 2018-01-06: 20 mg via INTRAVENOUS

## 2018-01-06 MED ORDER — CHLORHEXIDINE GLUCONATE CLOTH 2 % EX PADS: 6.0000 | MEDICATED_PAD | Freq: Once | CUTANEOUS | Status: DC

## 2018-01-06 MED ORDER — DEXAMETHASONE SODIUM PHOSPHATE 10 MG/ML IJ SOLN
INTRAMUSCULAR | Status: AC
Start: 2018-01-06 — End: 2018-01-06
  Filled 2018-01-06: qty 1

## 2018-01-06 MED ORDER — INSULIN ASPART PROT & ASPART (70-30 MIX) 100 UNIT/ML ~~LOC~~ SUSP
30.0000 [IU] | Freq: Every day | SUBCUTANEOUS | Status: DC
Start: 2018-01-07 — End: 2018-01-09
  Administered 2018-01-07 – 2018-01-08 (×2): 30 [IU] via SUBCUTANEOUS
  Filled 2018-01-06: qty 10

## 2018-01-06 MED ORDER — ACETAMINOPHEN 500 MG PO TABS
1000.0000 mg | ORAL_TABLET | Freq: Four times a day (QID) | ORAL | Status: AC
  Administered 2018-01-06 – 2018-01-07 (×4): 1000 mg via ORAL
  Filled 2018-01-06 (×4): qty 2

## 2018-01-06 MED ORDER — INSULIN ASPART PROT & ASPART (70-30 MIX) 100 UNIT/ML ~~LOC~~ SUSP
30.0000 [IU] | Freq: Every day | SUBCUTANEOUS | Status: DC
  Administered 2018-01-07 – 2018-01-09 (×3): 30 [IU] via SUBCUTANEOUS
  Filled 2018-01-06: qty 10

## 2018-01-06 MED ORDER — FAMOTIDINE 20 MG PO TABS
20.0000 mg | ORAL_TABLET | Freq: Two times a day (BID) | ORAL | Status: DC
  Administered 2018-01-06 – 2018-01-09 (×6): 20 mg via ORAL
  Filled 2018-01-06 (×6): qty 1

## 2018-01-06 MED ORDER — LIDOCAINE 2% (20 MG/ML) 5 ML SYRINGE
INTRAMUSCULAR | Status: DC | PRN
  Administered 2018-01-06: 70 mg via INTRAVENOUS

## 2018-01-06 MED ORDER — CEFAZOLIN SODIUM-DEXTROSE 2-4 GM/100ML-% IV SOLN
2.0000 g | Freq: Three times a day (TID) | INTRAVENOUS | Status: AC
  Administered 2018-01-06 – 2018-01-07 (×2): 2 g via INTRAVENOUS
  Filled 2018-01-06 (×2): qty 100

## 2018-01-06 MED ORDER — CYCLOBENZAPRINE HCL 10 MG PO TABS
10.0000 mg | ORAL_TABLET | Freq: Three times a day (TID) | ORAL | Status: DC | PRN
  Administered 2018-01-07 – 2018-01-09 (×3): 10 mg via ORAL
  Filled 2018-01-06 (×3): qty 1

## 2018-01-06 MED ORDER — SODIUM CHLORIDE 0.9 % IR SOLN
Status: DC | PRN
  Administered 2018-01-06: 500 mL

## 2018-01-06 MED ORDER — SUGAMMADEX SODIUM 200 MG/2ML IV SOLN
INTRAVENOUS | Status: AC
  Filled 2018-01-06: qty 2

## 2018-01-06 MED ORDER — CEFAZOLIN SODIUM-DEXTROSE 2-4 GM/100ML-% IV SOLN
2.0000 g | INTRAVENOUS | Status: AC
  Administered 2018-01-06: 2 g via INTRAVENOUS

## 2018-01-06 MED ORDER — DEXAMETHASONE SODIUM PHOSPHATE 10 MG/ML IJ SOLN
INTRAMUSCULAR | Status: DC | PRN
  Administered 2018-01-06: 4 mg via INTRAVENOUS

## 2018-01-06 MED ORDER — FENTANYL CITRATE (PF) 100 MCG/2ML IJ SOLN
INTRAMUSCULAR | Status: DC | PRN
  Administered 2018-01-06 (×5): 50 ug via INTRAVENOUS

## 2018-01-06 MED ORDER — ACETAMINOPHEN 650 MG RE SUPP: 650.0000 mg | RECTAL | Status: DC | PRN

## 2018-01-06 MED ORDER — ATENOLOL-CHLORTHALIDONE 50-25 MG PO TABS: 1.0000 | ORAL_TABLET | Freq: Every day | ORAL | Status: DC

## 2018-01-06 MED ORDER — ALUM & MAG HYDROXIDE-SIMETH 200-200-20 MG/5ML PO SUSP: 30.0000 mL | Freq: Four times a day (QID) | ORAL | Status: DC | PRN

## 2018-01-06 MED ORDER — ZOLPIDEM TARTRATE 5 MG PO TABS: 5.0000 mg | ORAL_TABLET | Freq: Every evening | ORAL | Status: DC | PRN

## 2018-01-06 MED ORDER — BISACODYL 10 MG RE SUPP: 10.0000 mg | Freq: Every day | RECTAL | Status: DC | PRN

## 2018-01-06 MED ORDER — ACETAMINOPHEN 325 MG PO TABS
650.0000 mg | ORAL_TABLET | ORAL | Status: DC | PRN
  Administered 2018-01-08 – 2018-01-09 (×2): 650 mg via ORAL
  Filled 2018-01-06 (×2): qty 2

## 2018-01-06 MED ORDER — DEXAMETHASONE 2 MG PO TABS
2.0000 mg | ORAL_TABLET | Freq: Four times a day (QID) | ORAL | Status: AC
  Filled 2018-01-06 (×2): qty 1

## 2018-01-06 MED ORDER — MORPHINE SULFATE (PF) 4 MG/ML IV SOLN
4.0000 mg | INTRAVENOUS | Status: DC | PRN
  Administered 2018-01-07 – 2018-01-08 (×5): 4 mg via INTRAVENOUS
  Filled 2018-01-06 (×6): qty 1

## 2018-01-06 MED ORDER — BUPIVACAINE-EPINEPHRINE (PF) 0.5% -1:200000 IJ SOLN
INTRAMUSCULAR | Status: DC | PRN
  Administered 2018-01-06: 10 mL

## 2018-01-06 MED ORDER — ATENOLOL 50 MG PO TABS
50.0000 mg | ORAL_TABLET | Freq: Every day | ORAL | Status: DC
  Administered 2018-01-07 – 2018-01-09 (×3): 50 mg via ORAL
  Filled 2018-01-06 (×3): qty 1

## 2018-01-06 MED ORDER — PRAVASTATIN SODIUM 10 MG PO TABS
10.0000 mg | ORAL_TABLET | Freq: Every evening | ORAL | Status: DC
  Administered 2018-01-06 – 2018-01-08 (×3): 10 mg via ORAL
  Filled 2018-01-06 (×3): qty 1

## 2018-01-06 MED ORDER — PHENYLEPHRINE HCL 10 MG/ML IJ SOLN
INTRAMUSCULAR | Status: DC | PRN
  Administered 2018-01-06: 10 ug/min via INTRAVENOUS

## 2018-01-06 MED ORDER — DOCUSATE SODIUM 100 MG PO CAPS
100.0000 mg | ORAL_CAPSULE | Freq: Two times a day (BID) | ORAL | Status: DC
  Administered 2018-01-06 – 2018-01-09 (×6): 100 mg via ORAL
  Filled 2018-01-06 (×6): qty 1

## 2018-01-06 MED ORDER — PROPOFOL 10 MG/ML IV BOLUS
INTRAVENOUS | Status: DC | PRN
  Administered 2018-01-06: 130 mg via INTRAVENOUS

## 2018-01-06 MED ORDER — BACITRACIN ZINC 500 UNIT/GM EX OINT
TOPICAL_OINTMENT | CUTANEOUS | Status: DC | PRN
  Administered 2018-01-06: 1 via TOPICAL

## 2018-01-06 MED ORDER — DULOXETINE HCL 20 MG PO CPEP
20.0000 mg | ORAL_CAPSULE | Freq: Every day | ORAL | Status: DC
  Administered 2018-01-07 – 2018-01-09 (×3): 20 mg via ORAL
  Filled 2018-01-06 (×3): qty 1

## 2018-01-06 MED ORDER — OXYCODONE HCL 5 MG PO TABS: 5.0000 mg | ORAL_TABLET | Freq: Once | ORAL | Status: DC | PRN

## 2018-01-06 MED ORDER — ROCURONIUM BROMIDE 10 MG/ML (PF) SYRINGE
PREFILLED_SYRINGE | INTRAVENOUS | Status: AC
  Filled 2018-01-06: qty 5

## 2018-01-06 MED ORDER — ONDANSETRON HCL 4 MG/2ML IJ SOLN
INTRAMUSCULAR | Status: DC | PRN
  Administered 2018-01-06: 4 mg via INTRAVENOUS

## 2018-01-06 MED ORDER — LACTATED RINGERS IV SOLN
INTRAVENOUS | Status: DC | PRN
  Administered 2018-01-06 (×2): via INTRAVENOUS

## 2018-01-06 MED ORDER — BUPIVACAINE-EPINEPHRINE (PF) 0.5% -1:200000 IJ SOLN
INTRAMUSCULAR | Status: AC
  Filled 2018-01-06: qty 30

## 2018-01-06 MED ORDER — LIDOCAINE HCL (CARDIAC) 20 MG/ML IV SOLN
INTRAVENOUS | Status: AC
  Filled 2018-01-06: qty 5

## 2018-01-06 MED ORDER — ONDANSETRON HCL 4 MG/2ML IJ SOLN: 4.0000 mg | Freq: Once | INTRAMUSCULAR | Status: DC | PRN

## 2018-01-06 MED ORDER — PHENYLEPHRINE 40 MCG/ML (10ML) SYRINGE FOR IV PUSH (FOR BLOOD PRESSURE SUPPORT)
PREFILLED_SYRINGE | INTRAVENOUS | Status: DC | PRN
  Administered 2018-01-06: 80 ug via INTRAVENOUS
  Administered 2018-01-06: 40 ug via INTRAVENOUS

## 2018-01-06 SURGICAL SUPPLY — 60 items
BAG DECANTER FOR FLEXI CONT (MISCELLANEOUS) ×3 IMPLANT
BENZOIN TINCTURE PRP APPL 2/3 (GAUZE/BANDAGES/DRESSINGS) ×3 IMPLANT
BIT DRILL NEURO 2X3.1 SFT TUCH (MISCELLANEOUS) ×1 IMPLANT
BLADE SURG 15 STRL LF DISP TIS (BLADE) ×1 IMPLANT
BLADE SURG 15 STRL SS (BLADE) ×2
BLADE ULTRA TIP 2M (BLADE) ×3 IMPLANT
BUR BARREL STRAIGHT FLUTE 4.0 (BURR) ×3 IMPLANT
BUR MATCHSTICK NEURO 3.0 LAGG (BURR) ×3 IMPLANT
CAGE PEEK VISTAS 11X14X6 (Cage) ×3 IMPLANT
CANISTER SUCT 3000ML PPV (MISCELLANEOUS) ×3 IMPLANT
CARTRIDGE OIL MAESTRO DRILL (MISCELLANEOUS) ×1 IMPLANT
CLOSURE WOUND 1/2 X4 (GAUZE/BANDAGES/DRESSINGS) ×1
COVER MAYO STAND STRL (DRAPES) ×3 IMPLANT
DECANTER SPIKE VIAL GLASS SM (MISCELLANEOUS) IMPLANT
DIFFUSER DRILL AIR PNEUMATIC (MISCELLANEOUS) ×3 IMPLANT
DRAPE LAPAROTOMY 100X72 PEDS (DRAPES) ×3 IMPLANT
DRAPE MICROSCOPE LEICA (MISCELLANEOUS) IMPLANT
DRAPE POUCH INSTRU U-SHP 10X18 (DRAPES) ×3 IMPLANT
DRAPE SURG 17X23 STRL (DRAPES) ×6 IMPLANT
DRILL NEURO 2X3.1 SOFT TOUCH (MISCELLANEOUS) ×3
DRSG OPSITE POSTOP 3X4 (GAUZE/BANDAGES/DRESSINGS) ×3 IMPLANT
ELECT REM PT RETURN 9FT ADLT (ELECTROSURGICAL) ×3
ELECTRODE REM PT RTRN 9FT ADLT (ELECTROSURGICAL) ×1 IMPLANT
GAUZE SPONGE 4X4 12PLY STRL (GAUZE/BANDAGES/DRESSINGS) IMPLANT
GAUZE SPONGE 4X4 16PLY XRAY LF (GAUZE/BANDAGES/DRESSINGS) IMPLANT
GLOVE BIO SURGEON STRL SZ7 (GLOVE) ×3 IMPLANT
GLOVE BIO SURGEON STRL SZ8 (GLOVE) ×3 IMPLANT
GLOVE BIO SURGEON STRL SZ8.5 (GLOVE) ×3 IMPLANT
GLOVE EXAM NITRILE LRG STRL (GLOVE) IMPLANT
GLOVE EXAM NITRILE XL STR (GLOVE) IMPLANT
GLOVE EXAM NITRILE XS STR PU (GLOVE) IMPLANT
GLOVE SURG SS PI 7.5 STRL IVOR (GLOVE) ×6 IMPLANT
GOWN STRL REUS W/ TWL LRG LVL3 (GOWN DISPOSABLE) ×1 IMPLANT
GOWN STRL REUS W/ TWL XL LVL3 (GOWN DISPOSABLE) ×1 IMPLANT
GOWN STRL REUS W/TWL LRG LVL3 (GOWN DISPOSABLE) ×2
GOWN STRL REUS W/TWL XL LVL3 (GOWN DISPOSABLE) ×2
HEMOSTAT POWDER KIT SURGIFOAM (HEMOSTASIS) ×3 IMPLANT
KIT BASIN OR (CUSTOM PROCEDURE TRAY) ×3 IMPLANT
KIT TURNOVER KIT B (KITS) ×3 IMPLANT
MARKER SKIN DUAL TIP RULER LAB (MISCELLANEOUS) ×3 IMPLANT
NEEDLE HYPO 22GX1.5 SAFETY (NEEDLE) ×3 IMPLANT
NEEDLE SPNL 18GX3.5 QUINCKE PK (NEEDLE) ×3 IMPLANT
NS IRRIG 1000ML POUR BTL (IV SOLUTION) ×3 IMPLANT
OIL CARTRIDGE MAESTRO DRILL (MISCELLANEOUS) ×3
PACK LAMINECTOMY NEURO (CUSTOM PROCEDURE TRAY) ×3 IMPLANT
PEEK S VISTA 7X11X14 (Peek) ×3 IMPLANT
PIN DISTRACTION 14MM (PIN) ×6 IMPLANT
PLATE ANT CERV XTEND ELD 1 L14 (Plate) ×6 IMPLANT
PUTTY KINEX BIOACTIVE 5CC (Bone Implant) ×3 IMPLANT
RUBBERBAND STERILE (MISCELLANEOUS) IMPLANT
SCREW XTD VAR 4.2 SELF TAP 12 (Screw) ×24 IMPLANT
SPONGE INTESTINAL PEANUT (DISPOSABLE) ×6 IMPLANT
SPONGE SURGIFOAM ABS GEL SZ50 (HEMOSTASIS) IMPLANT
STRIP CLOSURE SKIN 1/2X4 (GAUZE/BANDAGES/DRESSINGS) ×2 IMPLANT
SUT VIC AB 0 CT1 27 (SUTURE) ×2
SUT VIC AB 0 CT1 27XBRD ANTBC (SUTURE) ×1 IMPLANT
SUT VIC AB 3-0 SH 8-18 (SUTURE) ×3 IMPLANT
TOWEL GREEN STERILE (TOWEL DISPOSABLE) ×3 IMPLANT
TOWEL GREEN STERILE FF (TOWEL DISPOSABLE) ×3 IMPLANT
WATER STERILE IRR 1000ML POUR (IV SOLUTION) ×3 IMPLANT

## 2018-01-06 NOTE — H&P (Signed)
Subjective: The patient is an 83 year old white female whose had a previous C4-5 and C5-6 anterior cervical discectomy and fusion by another physician years ago.  She has developed recurrent neck pain with unsteady gait numbness tingling weakness, etc. consistent with a cervical myelopathy.  She was worked up with a cervical MRI.  This demonstrated significant stenosis at C3-4 and at C6-7.  I discussed the various treatment option with the patient.  She has decided to proceed with surgery after weighing the risks, benefits, and alternatives.  Past Medical History:  Diagnosis Date  . Anxiety   . Depression   . Diabetes mellitus (HCC)   . Diverticulosis   . History of shingles   . HTN (hypertension)   . Hyperlipidemia   . Pernicious anemia   . Thyroid nodule     Past Surgical History:  Procedure Laterality Date  . abalation of nerve     in back  . blepharplasty Bilateral   . CARPAL TUNNEL RELEASE Bilateral   . CATARACT EXTRACTION     bilateral  . cervical discestomy  1998  . CHOLECYSTECTOMY    . COLECTOMY    . EYE SURGERY Bilateral    cataract removal  . HERNIA REPAIR     x 2  . JOINT REPLACEMENT Left    total knee  . TONSILLECTOMY      Allergies  Allergen Reactions  . Shellfish Allergy Anaphylaxis and Swelling    Tongue swelled  . Codeine     Stomach upset  . Contrast Media [Iodinated Diagnostic Agents] Other (See Comments)    Pt cannot remember what occurred.  . Omeprazole     Makes nervous    Social History   Tobacco Use  . Smoking status: Never Smoker  . Smokeless tobacco: Never Used  Substance Use Topics  . Alcohol use: No    Family History  Problem Relation Age of Onset  . Cancer Father        prostate  . Coronary artery disease Neg Hx    Prior to Admission medications   Medication Sig Start Date End Date Taking? Authorizing Provider  acetaminophen (TYLENOL) 500 MG tablet Take 500-1,000 mg by mouth every 6 (six) hours as needed (for pain/headaches.).    Yes [provider]  aspirin EC 81 MG tablet Take 81 mg by mouth daily.   Yes [provider]  atenolol-chlorthalidone (TENORETIC) 50-25 MG per tablet Take 1 tablet by mouth daily.   Yes [provider]  DULoxetine (CYMBALTA) 20 MG capsule Take 20 mg by mouth daily.   Yes [provider]  gabapentin (NEURONTIN) 100 MG capsule Take 100 mg by mouth 3 (three) times daily.   Yes [provider]  insulin aspart protamine-insulin aspart (NOVOLOG 70/30) (70-30) 100 UNIT/ML injection Inject 30-35 Units into the skin 2 (two) times daily with a meal. 35 units in the morning before breakfast & 30 units in the evening before supper   Yes [provider]  potassium chloride (K-DUR) 10 MEQ tablet Take 10 mEq by mouth daily.   Yes [provider]  pravastatin (PRAVACHOL) 10 MG tablet Take 10 mg by mouth every evening.    Yes [provider]  ranitidine (ZANTAC) 150 MG tablet Take 150 mg by mouth 2 (two) times daily.   Yes [provider]  Vitamin D, Ergocalciferol, (DRISDOL) 50000 units CAPS capsule Take 50,000 Units by mouth every Friday.   Yes [provider]     Review of Systems  Positive ROS: As above  All other systems have been reviewed and were otherwise negative with the exception of those mentioned in the HPI and as above.  Objective: Vital signs in last 24 hours: Temp:  [97.8 F (36.6 C)] 97.8 F (36.6 C) (03/28 1152) Pulse Rate:  [60] 60 (03/28 1152) Resp:  [18] 18 (03/28 1152) BP: (136)/(64) 136/64 (03/28 1152) SpO2:  [100 %] 100 % (03/28 1152) Weight:  [72.1 kg (159 lb)] 72.1 kg (159 lb) (03/28 1152) Estimated body mass index is 29.08 kg/m as calculated from the following:   Height as of this encounter: 5\' 2"  (1.575 m).   Weight as of this encounter: 72.1 kg (159 lb).   General Appearance: Alert Head: Normocephalic, without obvious abnormality, atraumatic Eyes: PERRL, conjunctiva/corneas  clear, EOM's intact,    Ears: Normal  Throat: Normal  Neck: Supple, the patient's anterior cervical incision is well-healed. Back: unremarkable Lungs: Clear to auscultation bilaterally, respirations unlabored Heart: Regular rate and rhythm, no murmur, rub or gallop Abdomen: Soft, non-tender Extremities: Extremities normal, atraumatic, no cyanosis or edema Skin: unremarkable  NEUROLOGIC:   Mental status: alert and oriented,Motor Exam - grossly normal Sensory Exam - grossly normal Reflexes: Hyperreflexic Coordination - grossly normal Gait -unsteady Balance - grossly normal Cranial Nerves: I: smell Not tested  II: visual acuity  OS: Normal  OD: Normal   II: visual fields Full to confrontation  II: pupils Equal, round, reactive to light  III,VII: ptosis None  III,IV,VI: extraocular muscles  Full ROM  V: mastication Normal  V: facial light touch sensation  Normal  V,VII: corneal reflex  Present  VII: facial muscle function - upper  Normal  VII: facial muscle function - lower Normal  VIII: hearing Not tested  IX: soft palate elevation  Normal  IX,X: gag reflex Present  XI: trapezius strength  5/5  XI: sternocleidomastoid strength 5/5  XI: neck flexion strength  5/5  XII: tongue strength  Normal    Data Review Lab Results  Component Value Date   WBC 7.4 01/03/2018   HGB 13.0 01/03/2018   HCT 39.3 01/03/2018   MCV 92.3 01/03/2018   PLT 303 01/03/2018   Lab Results  Component Value Date   NA 138 01/03/2018   K 3.1 (L) 01/03/2018   CL 101 01/03/2018   CO2 28 01/03/2018   BUN 21 (H) 01/03/2018   CREATININE 0.84 01/03/2018   GLUCOSE 63 (L) 01/03/2018   Lab Results  Component Value Date   INR 1.2 (H) 07/29/2012    Assessment/Plan: C3-4 and C6-7 disc degeneration, spondylosis, stenosis, cervical myelopathy, cervicalgia, cervical radiculopathy: I have discussed the situation with the patient.  I have reviewed her imaging studies with her and pointed out the  abnormalities.  We have discussed the various treatment options including surgery.  I have described the surgical treatment option of a C3-4 and C6-7 anterior cervical discectomy, fusion and plating.  I have shown her surgical models.  I have given her a surgical pamphlet.  We have discussed the risks, benefits, alternatives, expected postoperative course, and likelihood of achieving our goals with surgery.  I have answered all the patient's questions.  She has decided to proceed with surgery.   Cristi Loron 01/06/2018 1:41 PM

## 2018-01-06 NOTE — Op Note (Signed)
Brief history: The patient is an 83 year old white female whose had a previous C4-5 and C5-6 anterior cervical discectomy and fusion by another physician about 20 years ago.  She has developed recurrent neck and arm pain with symptoms of myelopathy.  She was worked up with a cervical MRI which demonstrated significant narrowing at C3-4 and C6-7.  I discussed the various treatment options with the patient including surgery.  She has weighed the risks, benefits, and alternatives to surgery and decided to proceed with a C3-4 and C6-7 anterior cervical discectomy, fusion and plating.  Preoperative diagnosis: C3-4 and C6-7 disc degeneration, spondylosis, stenosis, cervical myelopathy, cervical radiculopathy, cervicalgia  Postoperative diagnosis: The same  Procedure: C3-4 and C6-7 anterior cervical discectomy/decompression; C3-4 and C6-7 interbody arthrodesis with local morcellized autograft bone and Kinnex bone graft extender; insertion of interbody prosthesis at C3-4 and C6-7 (Zimmer peek interbody prosthesis); anterior cervical plating from C3-4 and C6-7 with globus titanium plate  Surgeon: Dr. Delma Officer  Asst.: Dr. Newell Coral  Anesthesia: Gen. endotracheal  Estimated blood loss: 100 cc  Drains: None  Complications: None  Description of procedure: The patient was brought to the operating room by the anesthesia team. General endotracheal anesthesia was induced. A roll was placed under the patient's shoulders to keep the neck in the neutral position. The patient's anterior cervical region was then prepared with Betadine scrub and Betadine solution. Sterile drapes were applied.  The area to be incised was then injected with Marcaine with epinephrine solution. I then used a scalpel to make a transverse incision in the patient's right anterior neck. I used the Metzenbaum scissors to divide the platysmal muscle and then to dissect medial to the sternocleidomastoid muscle, jugular vein, and carotid  artery, carefully dissecting through the scar tissue from the previous operation.. I carefully dissected down towards the anterior cervical spine identifying the esophagus and retracting it medially. Then using Kitner swabs to clear soft tissue from the anterior cervical spine. We then inserted a bent spinal needle into the upper exposed intervertebral disc space. We then obtained intraoperative radiographs confirm our location.  I then used electrocautery to detach the medial border of the longus colli muscle bilaterally from the C3-4 and C6-7 intervertebral disc spaces. I then inserted the Caspar self-retaining retractor underneath the longus colli muscle bilaterally to provide exposure.  We then incised the intervertebral disc at C3-4. We then performed a partial intervertebral discectomy with a pituitary forceps and the Karlin curettes. I then inserted distraction screws into the vertebral bodies at C3-4. We then distracted the interspace. We then used the high-speed drill to decorticate the vertebral endplates at C3-4, to drill away the remainder of the intervertebral disc, to drill away some posterior spondylosis, and to thin out the posterior longitudinal ligament. I then incised ligament with the arachnoid knife. We then removed the ligament with a Kerrison punches undercutting the vertebral endplates and decompressing the thecal sac. We then performed foraminotomies about the bilateral C4 nerve roots. This completed the decompression at this level.  Then repeated this procedure in an analogous fashion at C6-7 decompressing the thecal sac and the bilateral C7 nerve roots.  We now turned our to attention to the interbody fusion. We used the trial spacers to determine the appropriate size for the interbody prosthesis. We then pre-filled prosthesis with a combination of local morcellized autograft bone that we obtained during decompression as well as Kinnex bone graft extender. We then inserted the  prosthesis into the distracted interspace at C3-4 and  C6-7. We then removed the distraction screws. There was a good snug fit of the prosthesis in the interspace.  Having completed the fusion we now turned attention to the anterior spinal instrumentation. We used the high-speed drill to drill away some anterior spondylosis at the disc spaces so that the plate lay down flat. We selected the appropriate length titanium anterior cervical plate. We laid it along the anterior aspect of the vertebral bodies from C3-4 and C6-7, i.e. 2 plates. We then drilled 12 mm holes at C3, C4, C6 and C7. We then secured the plate to the vertebral bodies by placing two 12 mm self-tapping screws at C3, C4, C6, and C7. We then obtained intraoperative radiograph. The demonstrating good position of the instrumentation at C3-4, we could not see the instrumentation very well at C6-7 but it look good in vivo. We therefore secured the screws the plate the locking each cam. This completed the instrumentation.  We then obtained hemostasis using bipolar electrocautery. We irrigated the wound out with bacitracin solution. We then removed the retractor. We inspected the esophagus for any damage. There was none apparent. We then reapproximated patient's platysmal muscle with interrupted 3-0 Vicryl suture. We then reapproximated the subcutaneous tissue with interrupted 3-0 Vicryl suture. The skin was reapproximated with Steri-Strips and benzoin. The wound was then covered with bacitracin ointment. A sterile dressing was applied. The drapes were removed. Patient was subsequently extubated by the anesthesia team and transported to the post anesthesia care unit in stable condition. All sponge instrument and needle counts were reportedly correct at the end of this case.

## 2018-01-06 NOTE — Anesthesia Preprocedure Evaluation (Addendum)
Anesthesia Evaluation  Patient identified by MRN, date of birth, ID band Patient awake    Reviewed: Allergy & Precautions, NPO status , Patient's Chart, lab work & pertinent test results, reviewed documented beta blocker date and time   Airway Mallampati: II  TM Distance: >3 FB Neck ROM: Full    Dental  (+) Dental Advisory Given, Teeth Intact   Pulmonary neg pulmonary ROS,    Pulmonary exam normal breath sounds clear to auscultation       Cardiovascular hypertension, Pt. on medications and Pt. on home beta blockers Normal cardiovascular exam Rhythm:Regular Rate:Normal  EKG - SR with 1st deg AVB   Neuro/Psych Anxiety Depression negative neurological ROS     GI/Hepatic Neg liver ROS, GERD  Medicated and Controlled,  Endo/Other  diabetes, Insulin Dependent  Renal/GU negative Renal ROS  negative genitourinary   Musculoskeletal negative musculoskeletal ROS (+)   Abdominal   Peds  Hematology negative hematology ROS (+)   Anesthesia Other Findings   Reproductive/Obstetrics                           Anesthesia Physical Anesthesia Plan  ASA: II  Anesthesia Plan: General   Post-op Pain Management:    Induction: Intravenous  PONV Risk Score and Plan: 3 and Treatment may vary due to age or medical condition, Ondansetron and Dexamethasone  Airway Management Planned: Oral ETT and Video Laryngoscope Planned  Additional Equipment: None  Intra-op Plan:   Post-operative Plan: Extubation in OR  Informed Consent: I have reviewed the patients History and Physical, chart, labs and discussed the procedure including the risks, benefits and alternatives for the proposed anesthesia with the patient or authorized representative who has indicated his/her understanding and acceptance.   Dental advisory given  Plan Discussed with: CRNA and Anesthesiologist  Anesthesia Plan Comments:          Anesthesia Quick Evaluation

## 2018-01-06 NOTE — Progress Notes (Signed)
Orthopedic Tech Progress Note Patient Details:  Andrea Gordon 04/15/1935 [id number] rn to order c-collar from [facility]. Patient ID: Andrea Gordon, female   DOB: 08/23/1935, 83 y.o.   MRN: [id number]   Andrea Gordon, Andrea Gordon 01/06/2018, 10:45 PM

## 2018-01-06 NOTE — Anesthesia Procedure Notes (Signed)
Procedure Name: Intubation Date/Time: 01/06/2018 1:53 PM Performed by: Rachel MouldsLee, Freman Lapage B, CRNA Pre-anesthesia Checklist: Patient identified, Suction available, Emergency Drugs available, Patient being monitored and Timeout performed Patient Re-evaluated:Patient Re-evaluated prior to induction Oxygen Delivery Method: Circle system utilized Preoxygenation: Pre-oxygenation with 100% oxygen Induction Type: IV induction Ventilation: Mask ventilation without difficulty Grade View: Grade I Tube type: Oral Tube size: 7.0 mm Number of attempts: 1 Airway Equipment and Method: Stylet and Video-laryngoscopy Placement Confirmation: ETT inserted through vocal cords under direct vision,  positive ETCO2,  CO2 detector and breath sounds checked- equal and bilateral Secured at: 21 cm Tube secured with: Tape Dental Injury: Teeth and Oropharynx as per pre-operative assessment  Comments: Elective glidescope due to pain with movement of neck

## 2018-01-06 NOTE — Transfer of Care (Signed)
Immediate Anesthesia Transfer of Care Note  Patient: Andrea Gordon  Procedure(s) Performed: ANTERIOR CERVICAL DISCECTOMY CERVICAL THREE- CERVICAL FOUR, CERVICAL SIX- CERVICAL SEVEN FUSION, INTERBODY PROSTHESIS AND ANTERIOR PLATING (N/A Spine Cervical)  Patient Location: PACU  Anesthesia Type:General  Level of Consciousness: awake, alert  and patient cooperative  Airway & Oxygen Therapy: Patient Spontanous Breathing  Post-op Assessment: Report given to RN and Post -op Vital signs reviewed and stable  Post vital signs: Reviewed and stable  Last Vitals:  Vitals Value Taken Time  BP 128/86 01/06/2018  5:07 PM  Temp 36.6 C 01/06/2018  5:08 PM  Pulse 77 01/06/2018  5:12 PM  Resp 14 01/06/2018  5:12 PM  SpO2 92 % 01/06/2018  5:12 PM  Vitals shown include unvalidated device data.  Last Pain:  Vitals:   01/06/18 1708  TempSrc:   PainSc: 0-No pain      Patients Stated Pain Goal: 1 (01/06/18 1210)  Complications: No apparent anesthesia complications

## 2018-01-06 NOTE — Progress Notes (Signed)
Subjective: The patient is somnolent but arousable.  She is in no apparent distress.  Objective: Vital signs in last 24 hours: Temp:  [97.8 F (36.6 C)] 97.8 F (36.6 C) (03/28 1152) Pulse Rate:  [60] 60 (03/28 1152) Resp:  [18] 18 (03/28 1152) BP: (136)/(64) 136/64 (03/28 1152) SpO2:  [100 %] 100 % (03/28 1152) Weight:  [72.1 kg (159 lb)] 72.1 kg (159 lb) (03/28 1152) Estimated body mass index is 29.08 kg/m as calculated from the following:   Height as of this encounter: 5\' 2"  (1.575 m).   Weight as of this encounter: 72.1 kg (159 lb).   Intake/Output from previous day: No intake/output data recorded. Intake/Output this shift: Total I/O In: 1100 [I.V.:1100] Out: 75 [Blood:75]  Physical exam the patient is somnolent but arousable.  She is moving all 4 extremities well.  Her wound is flat.  There is no hematoma or shift.  Lab Results: No results for input(s): WBC, HGB, HCT, PLT in the last 72 hours. BMET No results for input(s): NA, K, CL, CO2, GLUCOSE, BUN, CREATININE, CALCIUM in the last 72 hours.  Studies/Results: Dg Cervical Spine 2-3 Views  Result Date: 01/06/2018 CLINICAL DATA:  Two-view cervical spine EXAM: CERVICAL SPINE - 2-3 VIEW COMPARISON:  MRI of the cervical spine of November 08, 2017 FINDINGS: The patient has undergone ACDF at C3-4. Radiographic positioning of the prosthetic components is good. The trachea is intubated. There is partial fusion across the C4-5 disc space and known fusion across the C5-6 disc space. IMPRESSION: Status post ACDF at C3-4 without immediate postprocedure complication. Electronically Signed   By: David  Swazil[place]Jordan M.D.   On: 01/06/2018 16:14    Assessment/Plan: The patient is doing well.  There are no family members in the waiting room.  LOS: 0 days     Cristi LoronJeffrey D Jariyah Hackley 01/06/2018, 5:02 PM

## 2018-01-07 ENCOUNTER — Encounter (HOSPITAL_COMMUNITY): Payer: Self-pay | Admitting: General Practice

## 2018-01-07 ENCOUNTER — Other Ambulatory Visit: Payer: Self-pay

## 2018-01-07 LAB — GLUCOSE, CAPILLARY
GLUCOSE-CAPILLARY: 182 mg/dL — AB (ref 65–99)
GLUCOSE-CAPILLARY: 108 mg/dL — AB (ref 65–99)
Glucose-Capillary: 158 mg/dL — ABNORMAL HIGH (ref 65–99)
Glucose-Capillary: 209 mg/dL — ABNORMAL HIGH (ref 65–99)
Glucose-Capillary: 146 mg/dL — ABNORMAL HIGH (ref 65–99)

## 2018-01-07 MED ORDER — PANTOPRAZOLE SODIUM 40 MG PO TBEC
40.0000 mg | DELAYED_RELEASE_TABLET | Freq: Every day | ORAL | Status: DC
  Administered 2018-01-07 – 2018-01-08 (×2): 40 mg via ORAL
  Filled 2018-01-07 (×2): qty 1

## 2018-01-07 MED FILL — Thrombin For Soln 5000 Unit: CUTANEOUS | Qty: 5000 | Status: AC

## 2018-01-07 NOTE — Progress Notes (Signed)
Received patient from PACU with dressing on her left neck, AOx4, VS stable with soft BP at 128/39, not in distress, denies any pain, neurocheck is normal and O2Sat at 100% on RA.  [facility]  Aspen collar off since pt.now resting on bed comfortably.  Will monitor

## 2018-01-07 NOTE — Progress Notes (Signed)
Subjective: The patient is alert and pleasant.  She says she has not been out of bed yet.  She wants to go home this weekend.  Is not ready to go home yet.  Objective: Vital signs in last 24 hours: Temp:  [97.5 F (36.4 C)-98.6 F (37 C)] 98.6 F (37 C) (03/29 0350) Pulse Rate:  [60-82] 79 (03/29 0350) Resp:  [11-18] 16 (03/29 0350) BP: (121-141)/(39-86) 125/55 (03/29 0350) SpO2:  [92 %-100 %] 98 % (03/29 0350) Weight:  [72.1 kg (159 lb)-75.1 kg (165 lb 9.1 oz)] 75.1 kg (165 lb 9.1 oz) (03/28 1905) Estimated body mass index is 30.28 kg/m as calculated from the following:   Height as of this encounter: 5\' 2"  (1.575 m).   Weight as of this encounter: 75.1 kg (165 lb 9.1 oz).   Intake/Output from previous day: 03/28 0701 - 03/29 0700 In: 2452.5 [P.O.:330; I.V.:1922.5; IV Piggyback:200] Out: 75 [Blood:75] Intake/Output this shift: Total I/O In: 300 [P.O.:300] Out: 200 [Urine:200]  Physical exam the patient is alert and pleasant.  She is moving all 4 extremities well.  Her dressing has a small amount of bloody discharge.  There is no hematoma or shift.  Lab Results: No results for input(s): WBC, HGB, HCT, PLT in the last 72 hours. BMET No results for input(s): NA, K, CL, CO2, GLUCOSE, BUN, CREATININE, CALCIUM in the last 72 hours.  Studies/Results: Dg Cervical Spine 2-3 Views  Result Date: 01/06/2018 CLINICAL DATA:  Two-view cervical spine EXAM: CERVICAL SPINE - 2-3 VIEW COMPARISON:  MRI of the cervical spine of November 08, 2017 FINDINGS: The patient has undergone ACDF at C3-4. Radiographic positioning of the prosthetic components is good. The trachea is intubated. There is partial fusion across the C4-5 disc space and known fusion across the C5-6 disc space. IMPRESSION: Status post ACDF at C3-4 without immediate postprocedure complication. Electronically Signed   By: David  [place]Jordan M.D.   On: 01/06/2018 16:14    Assessment/Plan: Postop day #1: We will mobilize the patient.  We  will change her dressing.  She will likely home over the weekend.  LOS: 1 day     Cristi LoronJeffrey D Majid Mccravy 01/07/2018, 9:24 AM

## 2018-01-08 LAB — GLUCOSE, CAPILLARY
GLUCOSE-CAPILLARY: 182 mg/dL — AB (ref 65–99)
GLUCOSE-CAPILLARY: 131 mg/dL — AB (ref 65–99)
GLUCOSE-CAPILLARY: 85 mg/dL (ref 65–99)
GLUCOSE-CAPILLARY: 168 mg/dL — AB (ref 65–99)
Glucose-Capillary: 48 mg/dL — ABNORMAL LOW (ref 65–99)

## 2018-01-08 MED ORDER — INSULIN ASPART 100 UNIT/ML ~~LOC~~ SOLN
0.0000 [IU] | Freq: Three times a day (TID) | SUBCUTANEOUS | Status: DC
  Administered 2018-01-08: 4 [IU] via SUBCUTANEOUS

## 2018-01-08 NOTE — Progress Notes (Signed)
Subjective: The patient is alert and pleasant.  She feels more sore today.  Her grandson is at the bedside.  Objective: Vital signs in last 24 hours: Temp:  [98.2 F (36.8 C)-98.4 F (36.9 C)] 98.2 F (36.8 C) (03/30 0505) Pulse Rate:  [60-73] 68 (03/30 0505) Resp:  [16-17] 17 (03/30 0505) BP: (102-112)/(39-56) 112/56 (03/30 0505) SpO2:  [96 %-99 %] 96 % (03/30 0505) Estimated body mass index is 30.28 kg/m as calculated from the following:   Height as of this encounter: 5\' 2"  (1.575 m).   Weight as of this encounter: 75.1 kg (165 lb 9.1 oz).   Intake/Output from previous day: 03/29 0701 - 03/30 0700 In: 1010 [P.O.:410; I.V.:600] Out: 1450 [Urine:1450] Intake/Output this shift: Total I/O In: -  Out: 250 [Urine:250]  Physical exam the patient is alert and pleasant.  Her dressing is clean and dry.  There is no hematoma or shift.  She is moving all 4 extremities well.  Lab Results: No results for input(s): WBC, HGB, HCT, PLT in the last 72 hours. BMET No results for input(s): NA, K, CL, CO2, GLUCOSE, BUN, CREATININE, CALCIUM in the last 72 hours.  Studies/Results: Dg Cervical Spine 2-3 Views  Result Date: 01/06/2018 CLINICAL DATA:  Two-view cervical spine EXAM: CERVICAL SPINE - 2-3 VIEW COMPARISON:  MRI of the cervical spine of November 08, 2017 FINDINGS: The patient has undergone ACDF at C3-4. Radiographic positioning of the prosthetic components is good. The trachea is intubated. There is partial fusion across the C4-5 disc space and known fusion across the C5-6 disc space. IMPRESSION: Status post ACDF at C3-4 without immediate postprocedure complication. Electronically Signed   By: David  [place]Jordan M.D.   On: 01/06/2018 16:14    Assessment/Plan: Postop day #2: The patient does not feel she is quite ready to go home yet.  She will likely go home tomorrow.  LOS: 2 days     Cristi LoronJeffrey D Chara Marquard 01/08/2018, 10:04 AM

## 2018-01-09 LAB — GLUCOSE, CAPILLARY
GLUCOSE-CAPILLARY: 105 mg/dL — AB (ref 65–99)
Glucose-Capillary: 86 mg/dL (ref 65–99)

## 2018-01-09 MED ORDER — HYDROMORPHONE HCL 2 MG PO TABS: 2.0000 mg | ORAL_TABLET | ORAL | 0 refills | Status: AC | PRN

## 2018-01-09 MED ORDER — DOCUSATE SODIUM 100 MG PO CAPS: 100.0000 mg | ORAL_CAPSULE | Freq: Two times a day (BID) | ORAL | 0 refills | Status: DC

## 2018-01-09 NOTE — Progress Notes (Signed)
Pt was discharged to go home.  Discharge instructions given.  Prescriptions sent electronically to the pharmacy of her choice.

## 2018-01-09 NOTE — Discharge Summary (Signed)
Physician Discharge Summary  Patient ID: Andrea Gordon MRN: [id number] DOB/AGE: 05/23/1935 83 y.o.  Admit date: 01/06/2018 Discharge date: 01/09/2018  Admission Diagnoses: C3-4 and C6-7 disc degeneration, spondylosis, stenosis, cervical radiculopathy, cervical myelopathy  Discharge Diagnoses: The same Active Problems:   Stenosis of cervical spine with myelopathy   Discharged Condition: good  Hospital Course: I performed a C3-4 and C6-7 anterior cervical discectomy, fusion and plating on the patient on 01/06/2018.  The surgery went well.  The patient's postoperative course was unremarkable.  On 01/09/2018 the patient requested discharge to home.  Her grandson is going to stay with her for a few days.  She was given written and oral discharge instructions.  All her questions were answered.  Consults: None Significant Diagnostic Studies: None Treatments: C3-4 and C6-7 anterior cervical discectomy, fusion and plating. Discharge Exam: Blood pressure (!) 119/57, pulse 64, temperature 98.3 F (36.8 C), temperature source Oral, resp. rate 17, height 5\' 2"  (1.575 m), weight 75.1 kg (165 lb 9.1 oz), SpO2 95 %. Patient is alert and pleasant.  Her wound is healing well.  There is no signs of hematoma or shift.  Her strength is normal.  Disposition: Home  Discharge Instructions    Call MD for:  difficulty breathing, headache or visual disturbances   Complete by:  As directed    Call MD for:  extreme fatigue   Complete by:  As directed    Call MD for:  hives   Complete by:  As directed    Call MD for:  persistant dizziness or light-headedness   Complete by:  As directed    Call MD for:  persistant nausea and vomiting   Complete by:  As directed    Call MD for:  redness, tenderness, or signs of infection (pain, swelling, redness, odor or green/yellow discharge around incision site)   Complete by:  As directed    Call MD for:  severe uncontrolled pain   Complete by:  As directed    Call MD  for:  temperature >100.4   Complete by:  As directed    Diet - low sodium heart healthy   Complete by:  As directed    Discharge instructions   Complete by:  As directed    Call [phone] for a followup appointment. Take a stool softener while you are using pain medications.   Driving Restrictions   Complete by:  As directed    Do not drive for 2 weeks.   Increase activity slowly   Complete by:  As directed    Lifting restrictions   Complete by:  As directed    Do not lift more than 5 pounds. No excessive bending or twisting.   May shower / Bathe   Complete by:  As directed    Remove the dressing for 3 days after surgery.  You may shower, but leave the incision alone.   No dressing needed   Complete by:  As directed      Allergies as of 01/09/2018      Reactions   Shellfish Allergy Anaphylaxis, Swelling   Tongue swelled   Codeine    Stomach upset   Contrast Media [iodinated Diagnostic Agents] Other (See Comments)   Pt cannot remember what occurred.   Omeprazole    Makes nervous      Medication List    STOP taking these medications   acetaminophen 500 MG tablet Commonly known as:  TYLENOL     TAKE these medications  aspirin EC 81 MG tablet Take 81 mg by mouth daily.   atenolol-chlorthalidone 50-25 MG tablet Commonly known as:  TENORETIC Take 1 tablet by mouth daily.   docusate sodium 100 MG capsule Commonly known as:  COLACE Take 1 capsule (100 mg total) by mouth 2 (two) times daily.   DULoxetine 20 MG capsule Commonly known as:  CYMBALTA Take 20 mg by mouth daily.   gabapentin 100 MG capsule Commonly known as:  NEURONTIN Take 100 mg by mouth 3 (three) times daily.   HYDROmorphone 2 MG tablet Commonly known as:  DILAUDID Take 1 tablet (2 mg total) by mouth every 4 (four) hours as needed for severe pain.   insulin aspart protamine- aspart (70-30) 100 UNIT/ML injection Commonly known as:  NOVOLOG MIX 70/30 Inject 30-35 Units into the skin 2 (two)  times daily with a meal. 35 units in the morning before breakfast & 30 units in the evening before supper   potassium chloride 10 MEQ tablet Commonly known as:  K-DUR Take 10 mEq by mouth daily.   pravastatin 10 MG tablet Commonly known as:  PRAVACHOL Take 10 mg by mouth every evening.   ranitidine 150 MG tablet Commonly known as:  ZANTAC Take 150 mg by mouth 2 (two) times daily.   Vitamin D (Ergocalciferol) 50000 units Caps capsule Commonly known as:  DRISDOL Take 50,000 Units by mouth every Friday.        Signed: Cristi LoronJeffrey D Earnie Bechard 01/09/2018, 9:20 AM

## 2018-01-10 LAB — GLUCOSE, CAPILLARY
GLUCOSE-CAPILLARY: 101 mg/dL — AB (ref 65–99)
Glucose-Capillary: 67 mg/dL (ref 65–99)

## 2018-01-10 NOTE — Anesthesia Postprocedure Evaluation (Signed)
Anesthesia Post Note  Patient: Andrea Gordon  Procedure(s) Performed: ANTERIOR CERVICAL DISCECTOMY CERVICAL THREE- CERVICAL FOUR, CERVICAL SIX- CERVICAL SEVEN FUSION, INTERBODY PROSTHESIS AND ANTERIOR PLATING (N/A Spine Cervical)     Patient location during evaluation: PACU Anesthesia Type: General Level of consciousness: awake and alert Pain management: pain level controlled Vital Signs Assessment: post-procedure vital signs reviewed and stable Respiratory status: spontaneous breathing, nonlabored ventilation and respiratory function stable Cardiovascular status: blood pressure returned to baseline and stable Postop Assessment: no apparent nausea or vomiting Anesthetic complications: no    Last Vitals:  Vitals:   01/09/18 0509 01/09/18 1431  BP: (!) 119/57 (!) 123/54  Pulse: 64 72  Resp: 17   Temp: 36.8 C 36.8 C  SpO2: 95% 95%    Last Pain:  Vitals:   01/09/18 1431  TempSrc: Oral  PainSc:                  Beryle Lathehomas E Maguadalupe Lata

## 2018-01-10 NOTE — Consult Note (Signed)
            [facility]  01/10/2018  Drucie IpJoyce H Gordon 10/29/1934 [id number]  Went to seepatient at the bedsideto identify possible discharge needs but she was alreadydischargedper staff report.  Patient was discharged homeyesterday.  PerMD note,patient was admitted for cervical stenosis with recurrent neck pain, unsteady gait, numbness, tingling, weakness (status post anterior cervical discectomy, fusion and plating).   Primary care provider's office called(Dr. Rayfield Citizenaroline Prochnau)to notify of patient's discharge andneed for post hospital follow-up and transition of care (TOC).   Made aware [facility] if deemed necessary and appropriate for services.   For additional questions please contact:  Andrea GoldenLorraine A. [name], Andrea Gordon, Andrea Gordon [facility] PRIMARY [facility] Navigator Cell: [phone]

## 2018-02-07 DIAGNOSIS — R29898 Other symptoms and signs involving the musculoskeletal system: Secondary | ICD-10-CM | POA: Diagnosis not present

## 2018-02-07 DIAGNOSIS — R1310 Dysphagia, unspecified: Secondary | ICD-10-CM | POA: Diagnosis not present

## 2018-02-21 DIAGNOSIS — E049 Nontoxic goiter, unspecified: Secondary | ICD-10-CM | POA: Diagnosis not present

## 2018-02-21 DIAGNOSIS — I10 Essential (primary) hypertension: Secondary | ICD-10-CM | POA: Diagnosis not present

## 2018-02-21 DIAGNOSIS — Z4789 Encounter for other orthopedic aftercare: Secondary | ICD-10-CM | POA: Diagnosis not present

## 2018-02-21 DIAGNOSIS — R1310 Dysphagia, unspecified: Secondary | ICD-10-CM | POA: Diagnosis not present

## 2018-02-21 DIAGNOSIS — D510 Vitamin B12 deficiency anemia due to intrinsic factor deficiency: Secondary | ICD-10-CM | POA: Diagnosis not present

## 2018-02-21 DIAGNOSIS — K5790 Diverticulosis of intestine, part unspecified, without perforation or abscess without bleeding: Secondary | ICD-10-CM | POA: Diagnosis not present

## 2018-02-21 DIAGNOSIS — M6281 Muscle weakness (generalized): Secondary | ICD-10-CM | POA: Diagnosis not present

## 2018-02-21 DIAGNOSIS — K219 Gastro-esophageal reflux disease without esophagitis: Secondary | ICD-10-CM | POA: Diagnosis not present

## 2018-02-21 DIAGNOSIS — E119 Type 2 diabetes mellitus without complications: Secondary | ICD-10-CM | POA: Diagnosis not present

## 2018-02-23 DIAGNOSIS — I10 Essential (primary) hypertension: Secondary | ICD-10-CM | POA: Diagnosis not present

## 2018-02-23 DIAGNOSIS — E049 Nontoxic goiter, unspecified: Secondary | ICD-10-CM | POA: Diagnosis not present

## 2018-02-23 DIAGNOSIS — E119 Type 2 diabetes mellitus without complications: Secondary | ICD-10-CM | POA: Diagnosis not present

## 2018-02-23 DIAGNOSIS — D510 Vitamin B12 deficiency anemia due to intrinsic factor deficiency: Secondary | ICD-10-CM | POA: Diagnosis not present

## 2018-02-23 DIAGNOSIS — K5790 Diverticulosis of intestine, part unspecified, without perforation or abscess without bleeding: Secondary | ICD-10-CM | POA: Diagnosis not present

## 2018-02-23 DIAGNOSIS — K219 Gastro-esophageal reflux disease without esophagitis: Secondary | ICD-10-CM | POA: Diagnosis not present

## 2018-02-23 DIAGNOSIS — R1310 Dysphagia, unspecified: Secondary | ICD-10-CM | POA: Diagnosis not present

## 2018-02-23 DIAGNOSIS — M6281 Muscle weakness (generalized): Secondary | ICD-10-CM | POA: Diagnosis not present

## 2018-02-23 DIAGNOSIS — Z4789 Encounter for other orthopedic aftercare: Secondary | ICD-10-CM | POA: Diagnosis not present

## 2018-02-28 DIAGNOSIS — E049 Nontoxic goiter, unspecified: Secondary | ICD-10-CM | POA: Diagnosis not present

## 2018-02-28 DIAGNOSIS — R1310 Dysphagia, unspecified: Secondary | ICD-10-CM | POA: Diagnosis not present

## 2018-02-28 DIAGNOSIS — Z4789 Encounter for other orthopedic aftercare: Secondary | ICD-10-CM | POA: Diagnosis not present

## 2018-02-28 DIAGNOSIS — K5790 Diverticulosis of intestine, part unspecified, without perforation or abscess without bleeding: Secondary | ICD-10-CM | POA: Diagnosis not present

## 2018-02-28 DIAGNOSIS — E119 Type 2 diabetes mellitus without complications: Secondary | ICD-10-CM | POA: Diagnosis not present

## 2018-02-28 DIAGNOSIS — K219 Gastro-esophageal reflux disease without esophagitis: Secondary | ICD-10-CM | POA: Diagnosis not present

## 2018-02-28 DIAGNOSIS — I10 Essential (primary) hypertension: Secondary | ICD-10-CM | POA: Diagnosis not present

## 2018-02-28 DIAGNOSIS — M6281 Muscle weakness (generalized): Secondary | ICD-10-CM | POA: Diagnosis not present

## 2018-02-28 DIAGNOSIS — D510 Vitamin B12 deficiency anemia due to intrinsic factor deficiency: Secondary | ICD-10-CM | POA: Diagnosis not present

## 2018-03-02 DIAGNOSIS — K5790 Diverticulosis of intestine, part unspecified, without perforation or abscess without bleeding: Secondary | ICD-10-CM | POA: Diagnosis not present

## 2018-03-02 DIAGNOSIS — Z4789 Encounter for other orthopedic aftercare: Secondary | ICD-10-CM | POA: Diagnosis not present

## 2018-03-02 DIAGNOSIS — E119 Type 2 diabetes mellitus without complications: Secondary | ICD-10-CM | POA: Diagnosis not present

## 2018-03-02 DIAGNOSIS — M6281 Muscle weakness (generalized): Secondary | ICD-10-CM | POA: Diagnosis not present

## 2018-03-02 DIAGNOSIS — D510 Vitamin B12 deficiency anemia due to intrinsic factor deficiency: Secondary | ICD-10-CM | POA: Diagnosis not present

## 2018-03-02 DIAGNOSIS — I10 Essential (primary) hypertension: Secondary | ICD-10-CM | POA: Diagnosis not present

## 2018-03-02 DIAGNOSIS — R1310 Dysphagia, unspecified: Secondary | ICD-10-CM | POA: Diagnosis not present

## 2018-03-02 DIAGNOSIS — K219 Gastro-esophageal reflux disease without esophagitis: Secondary | ICD-10-CM | POA: Diagnosis not present

## 2018-03-02 DIAGNOSIS — E049 Nontoxic goiter, unspecified: Secondary | ICD-10-CM | POA: Diagnosis not present

## 2018-03-09 DIAGNOSIS — R1310 Dysphagia, unspecified: Secondary | ICD-10-CM | POA: Diagnosis not present

## 2018-03-09 DIAGNOSIS — I10 Essential (primary) hypertension: Secondary | ICD-10-CM | POA: Diagnosis not present

## 2018-03-09 DIAGNOSIS — M6281 Muscle weakness (generalized): Secondary | ICD-10-CM | POA: Diagnosis not present

## 2018-03-09 DIAGNOSIS — E049 Nontoxic goiter, unspecified: Secondary | ICD-10-CM | POA: Diagnosis not present

## 2018-03-09 DIAGNOSIS — K5790 Diverticulosis of intestine, part unspecified, without perforation or abscess without bleeding: Secondary | ICD-10-CM | POA: Diagnosis not present

## 2018-03-09 DIAGNOSIS — E119 Type 2 diabetes mellitus without complications: Secondary | ICD-10-CM | POA: Diagnosis not present

## 2018-03-09 DIAGNOSIS — Z4789 Encounter for other orthopedic aftercare: Secondary | ICD-10-CM | POA: Diagnosis not present

## 2018-03-09 DIAGNOSIS — D510 Vitamin B12 deficiency anemia due to intrinsic factor deficiency: Secondary | ICD-10-CM | POA: Diagnosis not present

## 2018-03-09 DIAGNOSIS — K219 Gastro-esophageal reflux disease without esophagitis: Secondary | ICD-10-CM | POA: Diagnosis not present

## 2018-03-14 DIAGNOSIS — I10 Essential (primary) hypertension: Secondary | ICD-10-CM | POA: Diagnosis not present

## 2018-03-14 DIAGNOSIS — E119 Type 2 diabetes mellitus without complications: Secondary | ICD-10-CM | POA: Diagnosis not present

## 2018-03-14 DIAGNOSIS — D510 Vitamin B12 deficiency anemia due to intrinsic factor deficiency: Secondary | ICD-10-CM | POA: Diagnosis not present

## 2018-03-14 DIAGNOSIS — R1310 Dysphagia, unspecified: Secondary | ICD-10-CM | POA: Diagnosis not present

## 2018-03-14 DIAGNOSIS — K5790 Diverticulosis of intestine, part unspecified, without perforation or abscess without bleeding: Secondary | ICD-10-CM | POA: Diagnosis not present

## 2018-03-14 DIAGNOSIS — Z4789 Encounter for other orthopedic aftercare: Secondary | ICD-10-CM | POA: Diagnosis not present

## 2018-03-14 DIAGNOSIS — E049 Nontoxic goiter, unspecified: Secondary | ICD-10-CM | POA: Diagnosis not present

## 2018-03-14 DIAGNOSIS — K219 Gastro-esophageal reflux disease without esophagitis: Secondary | ICD-10-CM | POA: Diagnosis not present

## 2018-03-14 DIAGNOSIS — M6281 Muscle weakness (generalized): Secondary | ICD-10-CM | POA: Diagnosis not present

## 2018-03-23 DIAGNOSIS — R918 Other nonspecific abnormal finding of lung field: Secondary | ICD-10-CM | POA: Diagnosis not present

## 2018-03-23 DIAGNOSIS — R1032 Left lower quadrant pain: Secondary | ICD-10-CM | POA: Diagnosis not present

## 2018-03-23 DIAGNOSIS — Z9889 Other specified postprocedural states: Secondary | ICD-10-CM | POA: Diagnosis not present

## 2018-03-29 DIAGNOSIS — R109 Unspecified abdominal pain: Secondary | ICD-10-CM | POA: Diagnosis not present

## 2018-03-29 DIAGNOSIS — R1032 Left lower quadrant pain: Secondary | ICD-10-CM | POA: Diagnosis not present

## 2018-04-08 DIAGNOSIS — M4712 Other spondylosis with myelopathy, cervical region: Secondary | ICD-10-CM | POA: Diagnosis not present

## 2018-04-08 DIAGNOSIS — M25552 Pain in left hip: Secondary | ICD-10-CM | POA: Diagnosis not present

## 2018-04-08 DIAGNOSIS — Z6828 Body mass index (BMI) 28.0-28.9, adult: Secondary | ICD-10-CM | POA: Diagnosis not present

## 2018-04-13 DIAGNOSIS — E119 Type 2 diabetes mellitus without complications: Secondary | ICD-10-CM | POA: Diagnosis not present

## 2018-07-22 DIAGNOSIS — R079 Chest pain, unspecified: Secondary | ICD-10-CM | POA: Diagnosis not present

## 2018-07-22 DIAGNOSIS — E785 Hyperlipidemia, unspecified: Secondary | ICD-10-CM | POA: Diagnosis not present

## 2018-07-22 DIAGNOSIS — F419 Anxiety disorder, unspecified: Secondary | ICD-10-CM | POA: Diagnosis not present

## 2018-07-22 DIAGNOSIS — E876 Hypokalemia: Secondary | ICD-10-CM | POA: Diagnosis not present

## 2018-07-22 DIAGNOSIS — E7800 Pure hypercholesterolemia, unspecified: Secondary | ICD-10-CM | POA: Diagnosis not present

## 2018-07-22 DIAGNOSIS — F418 Other specified anxiety disorders: Secondary | ICD-10-CM | POA: Diagnosis not present

## 2018-07-22 DIAGNOSIS — R0602 Shortness of breath: Secondary | ICD-10-CM | POA: Diagnosis not present

## 2018-07-22 DIAGNOSIS — I10 Essential (primary) hypertension: Secondary | ICD-10-CM | POA: Diagnosis not present

## 2018-07-22 DIAGNOSIS — Z794 Long term (current) use of insulin: Secondary | ICD-10-CM | POA: Diagnosis not present

## 2018-07-22 DIAGNOSIS — K219 Gastro-esophageal reflux disease without esophagitis: Secondary | ICD-10-CM | POA: Diagnosis not present

## 2018-07-22 DIAGNOSIS — E119 Type 2 diabetes mellitus without complications: Secondary | ICD-10-CM | POA: Diagnosis not present

## 2018-07-22 DIAGNOSIS — M1990 Unspecified osteoarthritis, unspecified site: Secondary | ICD-10-CM | POA: Diagnosis not present

## 2018-07-22 DIAGNOSIS — E1165 Type 2 diabetes mellitus with hyperglycemia: Secondary | ICD-10-CM | POA: Diagnosis not present

## 2018-07-22 DIAGNOSIS — R072 Precordial pain: Secondary | ICD-10-CM | POA: Diagnosis not present

## 2018-07-22 DIAGNOSIS — R42 Dizziness and giddiness: Secondary | ICD-10-CM

## 2018-07-23 DIAGNOSIS — E119 Type 2 diabetes mellitus without complications: Secondary | ICD-10-CM | POA: Diagnosis not present

## 2018-07-23 DIAGNOSIS — R079 Chest pain, unspecified: Secondary | ICD-10-CM | POA: Diagnosis not present

## 2018-07-23 DIAGNOSIS — F419 Anxiety disorder, unspecified: Secondary | ICD-10-CM | POA: Diagnosis not present

## 2018-07-23 DIAGNOSIS — E876 Hypokalemia: Secondary | ICD-10-CM | POA: Diagnosis not present

## 2018-07-27 DIAGNOSIS — R110 Nausea: Secondary | ICD-10-CM | POA: Diagnosis not present

## 2018-07-27 DIAGNOSIS — Z6828 Body mass index (BMI) 28.0-28.9, adult: Secondary | ICD-10-CM | POA: Diagnosis not present

## 2018-07-27 DIAGNOSIS — E876 Hypokalemia: Secondary | ICD-10-CM | POA: Diagnosis not present

## 2018-07-27 DIAGNOSIS — E1169 Type 2 diabetes mellitus with other specified complication: Secondary | ICD-10-CM | POA: Diagnosis not present

## 2018-07-27 DIAGNOSIS — M79601 Pain in right arm: Secondary | ICD-10-CM | POA: Diagnosis not present

## 2018-07-27 DIAGNOSIS — M542 Cervicalgia: Secondary | ICD-10-CM | POA: Diagnosis not present

## 2018-07-27 DIAGNOSIS — Z794 Long term (current) use of insulin: Secondary | ICD-10-CM | POA: Diagnosis not present

## 2018-07-27 DIAGNOSIS — E663 Overweight: Secondary | ICD-10-CM | POA: Diagnosis not present

## 2018-07-29 DIAGNOSIS — R030 Elevated blood-pressure reading, without diagnosis of hypertension: Secondary | ICD-10-CM | POA: Diagnosis not present

## 2018-07-29 DIAGNOSIS — M4712 Other spondylosis with myelopathy, cervical region: Secondary | ICD-10-CM | POA: Diagnosis not present

## 2018-07-29 DIAGNOSIS — M542 Cervicalgia: Secondary | ICD-10-CM | POA: Diagnosis not present

## 2018-07-29 DIAGNOSIS — M25511 Pain in right shoulder: Secondary | ICD-10-CM | POA: Diagnosis not present

## 2018-09-07 DIAGNOSIS — E663 Overweight: Secondary | ICD-10-CM | POA: Diagnosis not present

## 2018-09-07 DIAGNOSIS — M4722 Other spondylosis with radiculopathy, cervical region: Secondary | ICD-10-CM | POA: Diagnosis not present

## 2018-09-07 DIAGNOSIS — Z794 Long term (current) use of insulin: Secondary | ICD-10-CM | POA: Diagnosis not present

## 2018-09-07 DIAGNOSIS — Z79899 Other long term (current) drug therapy: Secondary | ICD-10-CM | POA: Diagnosis not present

## 2018-09-07 DIAGNOSIS — I10 Essential (primary) hypertension: Secondary | ICD-10-CM | POA: Diagnosis not present

## 2018-09-07 DIAGNOSIS — E1169 Type 2 diabetes mellitus with other specified complication: Secondary | ICD-10-CM | POA: Diagnosis not present

## 2018-09-07 DIAGNOSIS — E785 Hyperlipidemia, unspecified: Secondary | ICD-10-CM | POA: Diagnosis not present

## 2018-09-07 DIAGNOSIS — Z6827 Body mass index (BMI) 27.0-27.9, adult: Secondary | ICD-10-CM | POA: Diagnosis not present

## 2018-10-19 DIAGNOSIS — M25511 Pain in right shoulder: Secondary | ICD-10-CM | POA: Diagnosis not present

## 2018-10-26 DIAGNOSIS — S46111A Strain of muscle, fascia and tendon of long head of biceps, right arm, initial encounter: Secondary | ICD-10-CM | POA: Diagnosis not present

## 2018-10-26 DIAGNOSIS — M75121 Complete rotator cuff tear or rupture of right shoulder, not specified as traumatic: Secondary | ICD-10-CM | POA: Diagnosis not present

## 2018-10-26 DIAGNOSIS — X58XXXA Exposure to other specified factors, initial encounter: Secondary | ICD-10-CM | POA: Diagnosis not present

## 2018-10-26 DIAGNOSIS — M25511 Pain in right shoulder: Secondary | ICD-10-CM | POA: Diagnosis not present

## 2018-11-23 DIAGNOSIS — M75101 Unspecified rotator cuff tear or rupture of right shoulder, not specified as traumatic: Secondary | ICD-10-CM | POA: Diagnosis not present

## 2018-11-29 DIAGNOSIS — E876 Hypokalemia: Secondary | ICD-10-CM | POA: Diagnosis not present

## 2018-11-29 DIAGNOSIS — I10 Essential (primary) hypertension: Secondary | ICD-10-CM | POA: Diagnosis not present

## 2018-11-29 DIAGNOSIS — Z6827 Body mass index (BMI) 27.0-27.9, adult: Secondary | ICD-10-CM | POA: Diagnosis not present

## 2018-11-29 DIAGNOSIS — E1169 Type 2 diabetes mellitus with other specified complication: Secondary | ICD-10-CM | POA: Diagnosis not present

## 2018-11-29 DIAGNOSIS — Z01818 Encounter for other preprocedural examination: Secondary | ICD-10-CM | POA: Diagnosis not present

## 2018-11-29 DIAGNOSIS — M25511 Pain in right shoulder: Secondary | ICD-10-CM | POA: Diagnosis not present

## 2018-11-29 DIAGNOSIS — Z794 Long term (current) use of insulin: Secondary | ICD-10-CM | POA: Diagnosis not present

## 2018-11-29 DIAGNOSIS — Z79899 Other long term (current) drug therapy: Secondary | ICD-10-CM | POA: Diagnosis not present

## 2018-12-21 DIAGNOSIS — E559 Vitamin D deficiency, unspecified: Secondary | ICD-10-CM | POA: Diagnosis not present

## 2018-12-21 DIAGNOSIS — Z01818 Encounter for other preprocedural examination: Secondary | ICD-10-CM | POA: Diagnosis not present

## 2018-12-21 DIAGNOSIS — M79609 Pain in unspecified limb: Secondary | ICD-10-CM | POA: Diagnosis not present

## 2018-12-21 DIAGNOSIS — Z79899 Other long term (current) drug therapy: Secondary | ICD-10-CM | POA: Diagnosis not present

## 2018-12-21 DIAGNOSIS — R52 Pain, unspecified: Secondary | ICD-10-CM | POA: Diagnosis not present

## 2019-01-24 DIAGNOSIS — M542 Cervicalgia: Secondary | ICD-10-CM | POA: Diagnosis not present

## 2019-03-15 DIAGNOSIS — Z6829 Body mass index (BMI) 29.0-29.9, adult: Secondary | ICD-10-CM | POA: Diagnosis not present

## 2019-03-15 DIAGNOSIS — Z794 Long term (current) use of insulin: Secondary | ICD-10-CM | POA: Diagnosis not present

## 2019-03-15 DIAGNOSIS — I10 Essential (primary) hypertension: Secondary | ICD-10-CM | POA: Diagnosis not present

## 2019-03-15 DIAGNOSIS — Z01818 Encounter for other preprocedural examination: Secondary | ICD-10-CM | POA: Diagnosis not present

## 2019-03-15 DIAGNOSIS — E1165 Type 2 diabetes mellitus with hyperglycemia: Secondary | ICD-10-CM | POA: Diagnosis not present

## 2019-03-15 DIAGNOSIS — Z79899 Other long term (current) drug therapy: Secondary | ICD-10-CM | POA: Diagnosis not present

## 2019-03-27 DIAGNOSIS — Z471 Aftercare following joint replacement surgery: Secondary | ICD-10-CM | POA: Diagnosis not present

## 2019-03-27 DIAGNOSIS — M7521 Bicipital tendinitis, right shoulder: Secondary | ICD-10-CM | POA: Diagnosis not present

## 2019-03-27 DIAGNOSIS — Z96611 Presence of right artificial shoulder joint: Secondary | ICD-10-CM | POA: Diagnosis not present

## 2019-03-27 DIAGNOSIS — R410 Disorientation, unspecified: Secondary | ICD-10-CM | POA: Diagnosis not present

## 2019-03-27 DIAGNOSIS — M12811 Other specific arthropathies, not elsewhere classified, right shoulder: Secondary | ICD-10-CM | POA: Diagnosis not present

## 2019-03-27 DIAGNOSIS — T40605A Adverse effect of unspecified narcotics, initial encounter: Secondary | ICD-10-CM | POA: Diagnosis not present

## 2019-03-27 DIAGNOSIS — E785 Hyperlipidemia, unspecified: Secondary | ICD-10-CM | POA: Diagnosis not present

## 2019-03-27 DIAGNOSIS — G8918 Other acute postprocedural pain: Secondary | ICD-10-CM | POA: Diagnosis not present

## 2019-03-27 DIAGNOSIS — E669 Obesity, unspecified: Secondary | ICD-10-CM | POA: Diagnosis not present

## 2019-03-27 DIAGNOSIS — R338 Other retention of urine: Secondary | ICD-10-CM | POA: Diagnosis not present

## 2019-03-27 DIAGNOSIS — M19011 Primary osteoarthritis, right shoulder: Secondary | ICD-10-CM | POA: Diagnosis not present

## 2019-03-27 DIAGNOSIS — R531 Weakness: Secondary | ICD-10-CM | POA: Diagnosis not present

## 2019-03-27 DIAGNOSIS — I2510 Atherosclerotic heart disease of native coronary artery without angina pectoris: Secondary | ICD-10-CM | POA: Diagnosis not present

## 2019-03-27 DIAGNOSIS — I10 Essential (primary) hypertension: Secondary | ICD-10-CM | POA: Diagnosis not present

## 2019-03-27 DIAGNOSIS — Z794 Long term (current) use of insulin: Secondary | ICD-10-CM | POA: Diagnosis not present

## 2019-03-29 DIAGNOSIS — M12811 Other specific arthropathies, not elsewhere classified, right shoulder: Secondary | ICD-10-CM

## 2019-03-29 DIAGNOSIS — M7521 Bicipital tendinitis, right shoulder: Secondary | ICD-10-CM

## 2019-03-29 DIAGNOSIS — M19011 Primary osteoarthritis, right shoulder: Secondary | ICD-10-CM

## 2019-03-31 DIAGNOSIS — I10 Essential (primary) hypertension: Secondary | ICD-10-CM | POA: Diagnosis not present

## 2019-03-31 DIAGNOSIS — Z96611 Presence of right artificial shoulder joint: Secondary | ICD-10-CM | POA: Diagnosis not present

## 2019-03-31 DIAGNOSIS — F329 Major depressive disorder, single episode, unspecified: Secondary | ICD-10-CM | POA: Diagnosis not present

## 2019-03-31 DIAGNOSIS — Z471 Aftercare following joint replacement surgery: Secondary | ICD-10-CM | POA: Diagnosis not present

## 2019-03-31 DIAGNOSIS — M75101 Unspecified rotator cuff tear or rupture of right shoulder, not specified as traumatic: Secondary | ICD-10-CM | POA: Diagnosis not present

## 2019-03-31 DIAGNOSIS — E785 Hyperlipidemia, unspecified: Secondary | ICD-10-CM | POA: Diagnosis not present

## 2019-03-31 DIAGNOSIS — M19011 Primary osteoarthritis, right shoulder: Secondary | ICD-10-CM | POA: Diagnosis not present

## 2019-03-31 DIAGNOSIS — E119 Type 2 diabetes mellitus without complications: Secondary | ICD-10-CM | POA: Diagnosis not present

## 2019-03-31 DIAGNOSIS — F419 Anxiety disorder, unspecified: Secondary | ICD-10-CM | POA: Diagnosis not present

## 2019-04-03 ENCOUNTER — Other Ambulatory Visit: Payer: Self-pay

## 2019-04-03 NOTE — Patient Outreach (Signed)
[facility]) Care Management  04/03/2019  Andrea Gordon 12/15/1934 [id number]   Referral Date: 04/03/2019 Referral Source: [employer] Report Date of Admission: Unknown Diagnosis: Osteosrthritis Date of Discharge: 03/30/2019 Facility: [facility]:  Guy attempt: Female answered stating patient asleep.  CM contact information given for return call.    Plan: RN CM will attempt patient again within 4 business days and send letter.    Jone Baseman, RN, MSN [facility] Care Management Care Management Coordinator Direct Line [phone] Toll Free: [phone]  Fax: [phone]

## 2019-04-05 DIAGNOSIS — F419 Anxiety disorder, unspecified: Secondary | ICD-10-CM | POA: Diagnosis not present

## 2019-04-05 DIAGNOSIS — E785 Hyperlipidemia, unspecified: Secondary | ICD-10-CM | POA: Diagnosis not present

## 2019-04-05 DIAGNOSIS — M19011 Primary osteoarthritis, right shoulder: Secondary | ICD-10-CM | POA: Diagnosis not present

## 2019-04-05 DIAGNOSIS — F329 Major depressive disorder, single episode, unspecified: Secondary | ICD-10-CM | POA: Diagnosis not present

## 2019-04-05 DIAGNOSIS — E119 Type 2 diabetes mellitus without complications: Secondary | ICD-10-CM | POA: Diagnosis not present

## 2019-04-05 DIAGNOSIS — Z96611 Presence of right artificial shoulder joint: Secondary | ICD-10-CM | POA: Diagnosis not present

## 2019-04-05 DIAGNOSIS — M75101 Unspecified rotator cuff tear or rupture of right shoulder, not specified as traumatic: Secondary | ICD-10-CM | POA: Diagnosis not present

## 2019-04-05 DIAGNOSIS — Z471 Aftercare following joint replacement surgery: Secondary | ICD-10-CM | POA: Diagnosis not present

## 2019-04-05 DIAGNOSIS — I10 Essential (primary) hypertension: Secondary | ICD-10-CM | POA: Diagnosis not present

## 2019-04-06 ENCOUNTER — Other Ambulatory Visit: Payer: Self-pay

## 2019-04-06 DIAGNOSIS — Z471 Aftercare following joint replacement surgery: Secondary | ICD-10-CM | POA: Diagnosis not present

## 2019-04-06 DIAGNOSIS — E785 Hyperlipidemia, unspecified: Secondary | ICD-10-CM | POA: Diagnosis not present

## 2019-04-06 DIAGNOSIS — E119 Type 2 diabetes mellitus without complications: Secondary | ICD-10-CM | POA: Diagnosis not present

## 2019-04-06 DIAGNOSIS — M75101 Unspecified rotator cuff tear or rupture of right shoulder, not specified as traumatic: Secondary | ICD-10-CM | POA: Diagnosis not present

## 2019-04-06 DIAGNOSIS — M19011 Primary osteoarthritis, right shoulder: Secondary | ICD-10-CM | POA: Diagnosis not present

## 2019-04-06 DIAGNOSIS — I10 Essential (primary) hypertension: Secondary | ICD-10-CM | POA: Diagnosis not present

## 2019-04-06 DIAGNOSIS — Z96611 Presence of right artificial shoulder joint: Secondary | ICD-10-CM | POA: Diagnosis not present

## 2019-04-06 DIAGNOSIS — F419 Anxiety disorder, unspecified: Secondary | ICD-10-CM | POA: Diagnosis not present

## 2019-04-06 DIAGNOSIS — F329 Major depressive disorder, single episode, unspecified: Secondary | ICD-10-CM | POA: Diagnosis not present

## 2019-04-06 NOTE — Patient Outreach (Signed)
[facility]) Care Management  04/06/2019  HARRISON PAULSON 30-Jun-1935 [id number]   Referral Date: 04/03/2019 Referral Source: [employer] Report Date of Admission: 03/27/2019 Diagnosis: Osteoarthritis /right shoulder replacement Date of Discharge: 03/30/2019 Facility: [facility]:  Mart attempt: spoke with patient. She is able to verify HIPAA.  She states that she has had some pain but otherwise doing ok.  She states that she will see the surgeon for follow up on next Tuesday and her family will be taking her.  She states that her grandchildren assist her with all her ADL's.  She has home health through [facility] and the therapist is to come back today.    Discussed with patient [facility] services and support and how we can support her.  Patient states that she has home health right now and decline the additional support right now. Advised that CM would send letter and brochure for further reference.  Patient is agreeable.    Plan: RN CM will close case.  Jone Baseman, RN, MSN [facility] Management Care Management Coordinator Direct Line [phone] Cell [phone] Toll Free: [phone]  Fax: [phone]

## 2019-04-07 DIAGNOSIS — Z96611 Presence of right artificial shoulder joint: Secondary | ICD-10-CM | POA: Diagnosis not present

## 2019-04-07 DIAGNOSIS — E785 Hyperlipidemia, unspecified: Secondary | ICD-10-CM | POA: Diagnosis not present

## 2019-04-07 DIAGNOSIS — E119 Type 2 diabetes mellitus without complications: Secondary | ICD-10-CM | POA: Diagnosis not present

## 2019-04-07 DIAGNOSIS — F329 Major depressive disorder, single episode, unspecified: Secondary | ICD-10-CM | POA: Diagnosis not present

## 2019-04-07 DIAGNOSIS — M75101 Unspecified rotator cuff tear or rupture of right shoulder, not specified as traumatic: Secondary | ICD-10-CM | POA: Diagnosis not present

## 2019-04-07 DIAGNOSIS — I10 Essential (primary) hypertension: Secondary | ICD-10-CM | POA: Diagnosis not present

## 2019-04-07 DIAGNOSIS — F419 Anxiety disorder, unspecified: Secondary | ICD-10-CM | POA: Diagnosis not present

## 2019-04-07 DIAGNOSIS — M19011 Primary osteoarthritis, right shoulder: Secondary | ICD-10-CM | POA: Diagnosis not present

## 2019-04-07 DIAGNOSIS — Z471 Aftercare following joint replacement surgery: Secondary | ICD-10-CM | POA: Diagnosis not present

## 2019-04-10 DIAGNOSIS — F329 Major depressive disorder, single episode, unspecified: Secondary | ICD-10-CM | POA: Diagnosis not present

## 2019-04-10 DIAGNOSIS — E785 Hyperlipidemia, unspecified: Secondary | ICD-10-CM | POA: Diagnosis not present

## 2019-04-10 DIAGNOSIS — Z471 Aftercare following joint replacement surgery: Secondary | ICD-10-CM | POA: Diagnosis not present

## 2019-04-10 DIAGNOSIS — M19011 Primary osteoarthritis, right shoulder: Secondary | ICD-10-CM | POA: Diagnosis not present

## 2019-04-10 DIAGNOSIS — M75101 Unspecified rotator cuff tear or rupture of right shoulder, not specified as traumatic: Secondary | ICD-10-CM | POA: Diagnosis not present

## 2019-04-10 DIAGNOSIS — F419 Anxiety disorder, unspecified: Secondary | ICD-10-CM | POA: Diagnosis not present

## 2019-04-10 DIAGNOSIS — Z96611 Presence of right artificial shoulder joint: Secondary | ICD-10-CM | POA: Diagnosis not present

## 2019-04-10 DIAGNOSIS — E119 Type 2 diabetes mellitus without complications: Secondary | ICD-10-CM | POA: Diagnosis not present

## 2019-04-10 DIAGNOSIS — I10 Essential (primary) hypertension: Secondary | ICD-10-CM | POA: Diagnosis not present

## 2019-04-21 DIAGNOSIS — M25511 Pain in right shoulder: Secondary | ICD-10-CM | POA: Diagnosis not present

## 2019-04-21 DIAGNOSIS — M6281 Muscle weakness (generalized): Secondary | ICD-10-CM | POA: Diagnosis not present

## 2019-04-21 DIAGNOSIS — M25611 Stiffness of right shoulder, not elsewhere classified: Secondary | ICD-10-CM | POA: Diagnosis not present

## 2019-04-26 DIAGNOSIS — M25511 Pain in right shoulder: Secondary | ICD-10-CM | POA: Diagnosis not present

## 2019-04-26 DIAGNOSIS — M25611 Stiffness of right shoulder, not elsewhere classified: Secondary | ICD-10-CM | POA: Diagnosis not present

## 2019-04-26 DIAGNOSIS — M6281 Muscle weakness (generalized): Secondary | ICD-10-CM | POA: Diagnosis not present

## 2019-04-26 DIAGNOSIS — I10 Essential (primary) hypertension: Secondary | ICD-10-CM | POA: Diagnosis not present

## 2019-05-03 DIAGNOSIS — M25611 Stiffness of right shoulder, not elsewhere classified: Secondary | ICD-10-CM | POA: Diagnosis not present

## 2019-05-03 DIAGNOSIS — M6281 Muscle weakness (generalized): Secondary | ICD-10-CM | POA: Diagnosis not present

## 2019-05-03 DIAGNOSIS — M25511 Pain in right shoulder: Secondary | ICD-10-CM | POA: Diagnosis not present

## 2019-05-05 DIAGNOSIS — M25511 Pain in right shoulder: Secondary | ICD-10-CM | POA: Diagnosis not present

## 2019-05-05 DIAGNOSIS — M25611 Stiffness of right shoulder, not elsewhere classified: Secondary | ICD-10-CM | POA: Diagnosis not present

## 2019-05-05 DIAGNOSIS — M6281 Muscle weakness (generalized): Secondary | ICD-10-CM | POA: Diagnosis not present

## 2019-05-09 DIAGNOSIS — M25511 Pain in right shoulder: Secondary | ICD-10-CM | POA: Diagnosis not present

## 2019-05-12 DIAGNOSIS — M25611 Stiffness of right shoulder, not elsewhere classified: Secondary | ICD-10-CM | POA: Diagnosis not present

## 2019-05-12 DIAGNOSIS — M6281 Muscle weakness (generalized): Secondary | ICD-10-CM | POA: Diagnosis not present

## 2019-05-12 DIAGNOSIS — M25511 Pain in right shoulder: Secondary | ICD-10-CM | POA: Diagnosis not present

## 2019-05-17 DIAGNOSIS — M25611 Stiffness of right shoulder, not elsewhere classified: Secondary | ICD-10-CM | POA: Diagnosis not present

## 2019-05-17 DIAGNOSIS — M6281 Muscle weakness (generalized): Secondary | ICD-10-CM | POA: Diagnosis not present

## 2019-05-17 DIAGNOSIS — M25511 Pain in right shoulder: Secondary | ICD-10-CM | POA: Diagnosis not present

## 2019-05-19 DIAGNOSIS — M25511 Pain in right shoulder: Secondary | ICD-10-CM | POA: Diagnosis not present

## 2019-05-19 DIAGNOSIS — M25611 Stiffness of right shoulder, not elsewhere classified: Secondary | ICD-10-CM | POA: Diagnosis not present

## 2019-05-19 DIAGNOSIS — M6281 Muscle weakness (generalized): Secondary | ICD-10-CM | POA: Diagnosis not present

## 2019-05-24 DIAGNOSIS — M25511 Pain in right shoulder: Secondary | ICD-10-CM | POA: Diagnosis not present

## 2019-05-24 DIAGNOSIS — M25611 Stiffness of right shoulder, not elsewhere classified: Secondary | ICD-10-CM | POA: Diagnosis not present

## 2019-05-24 DIAGNOSIS — M6281 Muscle weakness (generalized): Secondary | ICD-10-CM | POA: Diagnosis not present

## 2019-05-25 DIAGNOSIS — Z6825 Body mass index (BMI) 25.0-25.9, adult: Secondary | ICD-10-CM | POA: Diagnosis not present

## 2019-05-25 DIAGNOSIS — N390 Urinary tract infection, site not specified: Secondary | ICD-10-CM | POA: Diagnosis not present

## 2019-05-25 DIAGNOSIS — N898 Other specified noninflammatory disorders of vagina: Secondary | ICD-10-CM | POA: Diagnosis not present

## 2019-05-26 DIAGNOSIS — R300 Dysuria: Secondary | ICD-10-CM | POA: Diagnosis not present

## 2019-05-31 DIAGNOSIS — M25611 Stiffness of right shoulder, not elsewhere classified: Secondary | ICD-10-CM | POA: Diagnosis not present

## 2019-05-31 DIAGNOSIS — M6281 Muscle weakness (generalized): Secondary | ICD-10-CM | POA: Diagnosis not present

## 2019-05-31 DIAGNOSIS — M25511 Pain in right shoulder: Secondary | ICD-10-CM | POA: Diagnosis not present

## 2019-06-29 IMAGING — CR DG CERVICAL SPINE 2 OR 3 VIEWS
1 series · 1 of 1 positions shown · non-contrast
Comparison: MRI of the cervical spine November 08, 2017

CLINICAL DATA: Two-view cervical spine

EXAM:
CERVICAL SPINE - 2-3 VIEW

[xtable lateral]
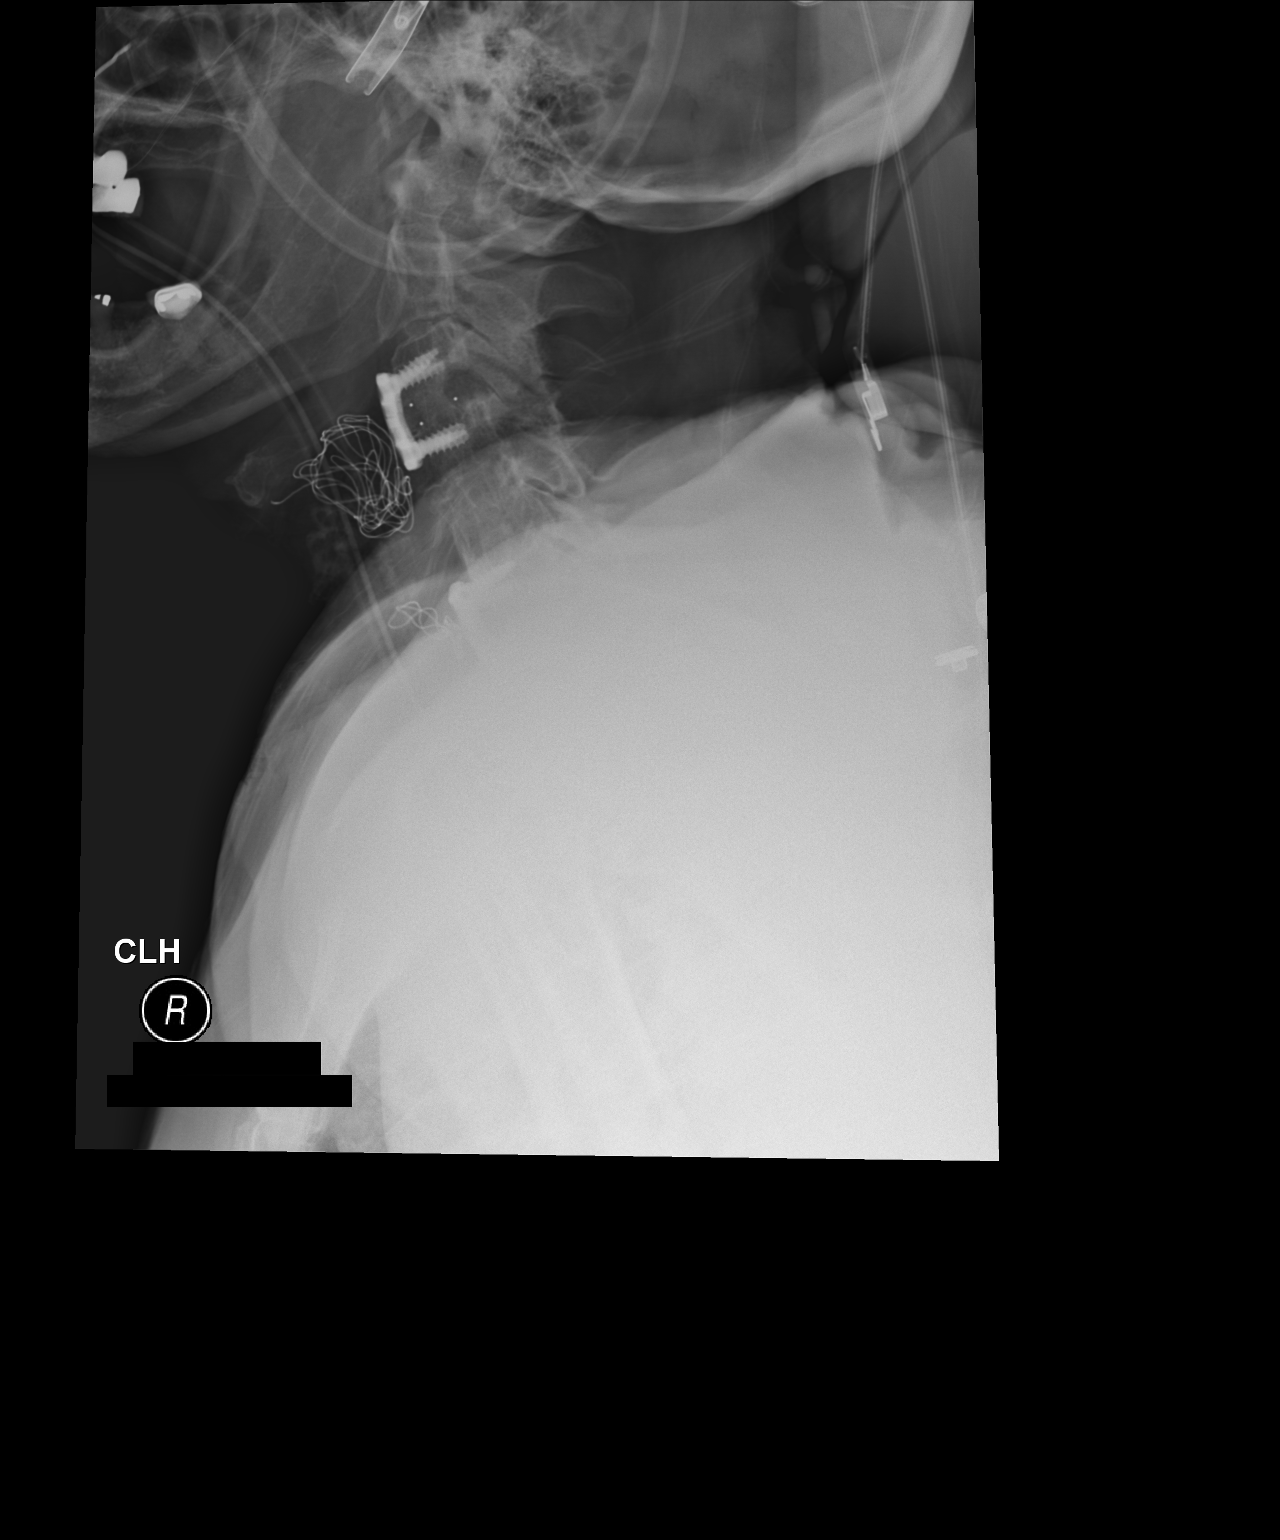

[1 of 1 positions shown; findings below may reference images not displayed]

FINDINGS: The patient has undergone ACDF at C3-4. Radiographic positioning of
the prosthetic components is good. The trachea is intubated. There
is partial fusion across the C4-5 disc space and known fusion across
the C5-6 disc space.
IMPRESSION: Status post ACDF at C3-4 without immediate postprocedure
complication.

## 2019-07-14 DIAGNOSIS — M25511 Pain in right shoulder: Secondary | ICD-10-CM | POA: Diagnosis not present

## 2019-07-19 DIAGNOSIS — M25611 Stiffness of right shoulder, not elsewhere classified: Secondary | ICD-10-CM | POA: Diagnosis not present

## 2019-07-19 DIAGNOSIS — M25511 Pain in right shoulder: Secondary | ICD-10-CM | POA: Diagnosis not present

## 2019-07-19 DIAGNOSIS — M6281 Muscle weakness (generalized): Secondary | ICD-10-CM | POA: Diagnosis not present

## 2019-08-04 DIAGNOSIS — M25511 Pain in right shoulder: Secondary | ICD-10-CM | POA: Diagnosis not present

## 2019-08-04 DIAGNOSIS — M6281 Muscle weakness (generalized): Secondary | ICD-10-CM | POA: Diagnosis not present

## 2019-08-04 DIAGNOSIS — M25611 Stiffness of right shoulder, not elsewhere classified: Secondary | ICD-10-CM | POA: Diagnosis not present

## 2019-08-11 DIAGNOSIS — M25511 Pain in right shoulder: Secondary | ICD-10-CM | POA: Diagnosis not present

## 2019-08-11 DIAGNOSIS — M25611 Stiffness of right shoulder, not elsewhere classified: Secondary | ICD-10-CM | POA: Diagnosis not present

## 2019-08-11 DIAGNOSIS — M6281 Muscle weakness (generalized): Secondary | ICD-10-CM | POA: Diagnosis not present

## 2019-08-18 DIAGNOSIS — M25511 Pain in right shoulder: Secondary | ICD-10-CM | POA: Diagnosis not present

## 2019-08-18 DIAGNOSIS — M25611 Stiffness of right shoulder, not elsewhere classified: Secondary | ICD-10-CM | POA: Diagnosis not present

## 2019-08-18 DIAGNOSIS — M6281 Muscle weakness (generalized): Secondary | ICD-10-CM | POA: Diagnosis not present

## 2019-08-23 DIAGNOSIS — M25511 Pain in right shoulder: Secondary | ICD-10-CM | POA: Diagnosis not present

## 2019-08-23 DIAGNOSIS — M25611 Stiffness of right shoulder, not elsewhere classified: Secondary | ICD-10-CM | POA: Diagnosis not present

## 2019-08-23 DIAGNOSIS — M6281 Muscle weakness (generalized): Secondary | ICD-10-CM | POA: Diagnosis not present

## 2019-08-25 DIAGNOSIS — Z6826 Body mass index (BMI) 26.0-26.9, adult: Secondary | ICD-10-CM | POA: Diagnosis not present

## 2019-08-25 DIAGNOSIS — M545 Low back pain: Secondary | ICD-10-CM | POA: Diagnosis not present

## 2019-08-25 DIAGNOSIS — Z79899 Other long term (current) drug therapy: Secondary | ICD-10-CM | POA: Diagnosis not present

## 2019-08-25 DIAGNOSIS — I10 Essential (primary) hypertension: Secondary | ICD-10-CM | POA: Diagnosis not present

## 2019-08-25 DIAGNOSIS — E1169 Type 2 diabetes mellitus with other specified complication: Secondary | ICD-10-CM | POA: Diagnosis not present

## 2019-08-25 DIAGNOSIS — R0982 Postnasal drip: Secondary | ICD-10-CM | POA: Diagnosis not present

## 2019-08-25 DIAGNOSIS — E785 Hyperlipidemia, unspecified: Secondary | ICD-10-CM | POA: Diagnosis not present

## 2019-09-13 DIAGNOSIS — R7989 Other specified abnormal findings of blood chemistry: Secondary | ICD-10-CM | POA: Diagnosis not present

## 2019-09-28 DIAGNOSIS — N3000 Acute cystitis without hematuria: Secondary | ICD-10-CM | POA: Diagnosis not present

## 2019-09-28 DIAGNOSIS — R42 Dizziness and giddiness: Secondary | ICD-10-CM | POA: Diagnosis not present

## 2019-09-28 DIAGNOSIS — R079 Chest pain, unspecified: Secondary | ICD-10-CM | POA: Diagnosis not present

## 2019-09-28 DIAGNOSIS — E860 Dehydration: Secondary | ICD-10-CM | POA: Diagnosis not present

## 2019-09-28 DIAGNOSIS — B9689 Other specified bacterial agents as the cause of diseases classified elsewhere: Secondary | ICD-10-CM | POA: Diagnosis not present

## 2019-10-11 DIAGNOSIS — F419 Anxiety disorder, unspecified: Secondary | ICD-10-CM | POA: Diagnosis not present

## 2019-10-11 DIAGNOSIS — N390 Urinary tract infection, site not specified: Secondary | ICD-10-CM | POA: Diagnosis not present

## 2019-10-11 DIAGNOSIS — E1169 Type 2 diabetes mellitus with other specified complication: Secondary | ICD-10-CM | POA: Diagnosis not present

## 2019-10-11 DIAGNOSIS — L659 Nonscarring hair loss, unspecified: Secondary | ICD-10-CM | POA: Diagnosis not present

## 2019-11-22 DIAGNOSIS — F419 Anxiety disorder, unspecified: Secondary | ICD-10-CM | POA: Diagnosis not present

## 2019-11-22 DIAGNOSIS — I10 Essential (primary) hypertension: Secondary | ICD-10-CM | POA: Diagnosis not present

## 2019-11-22 DIAGNOSIS — Z6825 Body mass index (BMI) 25.0-25.9, adult: Secondary | ICD-10-CM | POA: Diagnosis not present

## 2019-11-22 DIAGNOSIS — I700 Atherosclerosis of aorta: Secondary | ICD-10-CM | POA: Diagnosis not present

## 2019-11-22 DIAGNOSIS — E785 Hyperlipidemia, unspecified: Secondary | ICD-10-CM | POA: Diagnosis not present

## 2019-11-22 DIAGNOSIS — Z139 Encounter for screening, unspecified: Secondary | ICD-10-CM | POA: Diagnosis not present

## 2019-11-22 DIAGNOSIS — N1831 Chronic kidney disease, stage 3a: Secondary | ICD-10-CM | POA: Diagnosis not present

## 2019-11-22 DIAGNOSIS — E1169 Type 2 diabetes mellitus with other specified complication: Secondary | ICD-10-CM | POA: Diagnosis not present

## 2019-11-22 DIAGNOSIS — Z79899 Other long term (current) drug therapy: Secondary | ICD-10-CM | POA: Diagnosis not present

## 2019-12-05 DIAGNOSIS — F419 Anxiety disorder, unspecified: Secondary | ICD-10-CM | POA: Diagnosis not present

## 2019-12-05 DIAGNOSIS — N179 Acute kidney failure, unspecified: Secondary | ICD-10-CM | POA: Diagnosis not present

## 2019-12-05 DIAGNOSIS — F418 Other specified anxiety disorders: Secondary | ICD-10-CM | POA: Diagnosis not present

## 2019-12-05 DIAGNOSIS — E119 Type 2 diabetes mellitus without complications: Secondary | ICD-10-CM | POA: Diagnosis not present

## 2019-12-05 DIAGNOSIS — K5651 Intestinal adhesions [bands], with partial obstruction: Secondary | ICD-10-CM | POA: Diagnosis not present

## 2019-12-05 DIAGNOSIS — F0390 Unspecified dementia without behavioral disturbance: Secondary | ICD-10-CM | POA: Diagnosis not present

## 2019-12-05 DIAGNOSIS — K5730 Diverticulosis of large intestine without perforation or abscess without bleeding: Secondary | ICD-10-CM | POA: Diagnosis not present

## 2019-12-05 DIAGNOSIS — I10 Essential (primary) hypertension: Secondary | ICD-10-CM | POA: Diagnosis not present

## 2019-12-05 DIAGNOSIS — E876 Hypokalemia: Secondary | ICD-10-CM | POA: Diagnosis not present

## 2019-12-05 DIAGNOSIS — E7800 Pure hypercholesterolemia, unspecified: Secondary | ICD-10-CM | POA: Diagnosis not present

## 2019-12-05 DIAGNOSIS — K56609 Unspecified intestinal obstruction, unspecified as to partial versus complete obstruction: Secondary | ICD-10-CM | POA: Diagnosis not present

## 2019-12-05 DIAGNOSIS — R197 Diarrhea, unspecified: Secondary | ICD-10-CM | POA: Diagnosis not present

## 2019-12-12 ENCOUNTER — Other Ambulatory Visit: Payer: Self-pay

## 2019-12-12 NOTE — Patient Outreach (Signed)
[facility]) Care Management  12/12/2019  Andrea Gordon 03-01-1935 [id number]   Referral Date: 12/12/19 Referral Source: [employer] Report Referral Reason: Discharge [facility] 12/11/19.  Small bowel obstruction   Outreach Attempt: Spoke with patient. She reports she is doing better.  She states that her stomach is sore but otherwise she is okay.  She states she had a small bowel obstruction that resolved on its own and she is thankful.  Discussed bowel obstruction and signs of worsening.  She verbalized understanding.  Patient has home health through [facility] and has requested a first visit for tomorrow.    Social: Patient lives in the home with her grandson.  She reports that he assists her with ADL's when she needs him and takes her to her appointments.    Conditions: Patient admits to DM, HTN, and Hyperlipidemia.  She denies any problems with controlling sugars and blood sugars.    Medications: Patient reports taking medications as prescribed and having all medications.  Patient unable to review medications on the call.    Appointments: Patient has follow up with PCP on 12-20-19 and her grandson will take her.     Advanced Directives: Patient states she does not have an advanced directive and denies wanting to execute one at this time.     Consent: [facility] services and support with patient and further nurse follow up.  She declines for now but states that she will call CM if she needs additional follow up.  Advised CM would send letter.     Plan: RN CM will send patient letter and close case.    Bary Leriche, RN, MSN [facility] Care Management Care Management Coordinator Direct Line [phone] Toll Free: [phone]  Fax: [phone]

## 2019-12-13 DIAGNOSIS — Z96611 Presence of right artificial shoulder joint: Secondary | ICD-10-CM | POA: Diagnosis not present

## 2019-12-13 DIAGNOSIS — F329 Major depressive disorder, single episode, unspecified: Secondary | ICD-10-CM | POA: Diagnosis not present

## 2019-12-13 DIAGNOSIS — E119 Type 2 diabetes mellitus without complications: Secondary | ICD-10-CM | POA: Diagnosis not present

## 2019-12-13 DIAGNOSIS — M19032 Primary osteoarthritis, left wrist: Secondary | ICD-10-CM | POA: Diagnosis not present

## 2019-12-13 DIAGNOSIS — M19011 Primary osteoarthritis, right shoulder: Secondary | ICD-10-CM | POA: Diagnosis not present

## 2019-12-13 DIAGNOSIS — F419 Anxiety disorder, unspecified: Secondary | ICD-10-CM | POA: Diagnosis not present

## 2019-12-13 DIAGNOSIS — F0390 Unspecified dementia without behavioral disturbance: Secondary | ICD-10-CM | POA: Diagnosis not present

## 2019-12-13 DIAGNOSIS — E7800 Pure hypercholesterolemia, unspecified: Secondary | ICD-10-CM | POA: Diagnosis not present

## 2019-12-13 DIAGNOSIS — E785 Hyperlipidemia, unspecified: Secondary | ICD-10-CM | POA: Diagnosis not present

## 2019-12-13 DIAGNOSIS — K5790 Diverticulosis of intestine, part unspecified, without perforation or abscess without bleeding: Secondary | ICD-10-CM | POA: Diagnosis not present

## 2019-12-13 DIAGNOSIS — M75101 Unspecified rotator cuff tear or rupture of right shoulder, not specified as traumatic: Secondary | ICD-10-CM | POA: Diagnosis not present

## 2019-12-13 DIAGNOSIS — Z471 Aftercare following joint replacement surgery: Secondary | ICD-10-CM | POA: Diagnosis not present

## 2019-12-13 DIAGNOSIS — I10 Essential (primary) hypertension: Secondary | ICD-10-CM | POA: Diagnosis not present

## 2019-12-14 DIAGNOSIS — F419 Anxiety disorder, unspecified: Secondary | ICD-10-CM | POA: Diagnosis not present

## 2019-12-14 DIAGNOSIS — K5790 Diverticulosis of intestine, part unspecified, without perforation or abscess without bleeding: Secondary | ICD-10-CM | POA: Diagnosis not present

## 2019-12-14 DIAGNOSIS — F0390 Unspecified dementia without behavioral disturbance: Secondary | ICD-10-CM | POA: Diagnosis not present

## 2019-12-14 DIAGNOSIS — F329 Major depressive disorder, single episode, unspecified: Secondary | ICD-10-CM | POA: Diagnosis not present

## 2019-12-14 DIAGNOSIS — E7800 Pure hypercholesterolemia, unspecified: Secondary | ICD-10-CM | POA: Diagnosis not present

## 2019-12-14 DIAGNOSIS — I10 Essential (primary) hypertension: Secondary | ICD-10-CM | POA: Diagnosis not present

## 2019-12-14 DIAGNOSIS — E119 Type 2 diabetes mellitus without complications: Secondary | ICD-10-CM | POA: Diagnosis not present

## 2019-12-14 DIAGNOSIS — M19032 Primary osteoarthritis, left wrist: Secondary | ICD-10-CM | POA: Diagnosis not present

## 2019-12-14 DIAGNOSIS — E785 Hyperlipidemia, unspecified: Secondary | ICD-10-CM | POA: Diagnosis not present

## 2019-12-19 DIAGNOSIS — F329 Major depressive disorder, single episode, unspecified: Secondary | ICD-10-CM | POA: Diagnosis not present

## 2019-12-19 DIAGNOSIS — M19032 Primary osteoarthritis, left wrist: Secondary | ICD-10-CM | POA: Diagnosis not present

## 2019-12-19 DIAGNOSIS — I10 Essential (primary) hypertension: Secondary | ICD-10-CM | POA: Diagnosis not present

## 2019-12-19 DIAGNOSIS — K5790 Diverticulosis of intestine, part unspecified, without perforation or abscess without bleeding: Secondary | ICD-10-CM | POA: Diagnosis not present

## 2019-12-19 DIAGNOSIS — E119 Type 2 diabetes mellitus without complications: Secondary | ICD-10-CM | POA: Diagnosis not present

## 2019-12-19 DIAGNOSIS — E785 Hyperlipidemia, unspecified: Secondary | ICD-10-CM | POA: Diagnosis not present

## 2019-12-19 DIAGNOSIS — F419 Anxiety disorder, unspecified: Secondary | ICD-10-CM | POA: Diagnosis not present

## 2019-12-19 DIAGNOSIS — E7800 Pure hypercholesterolemia, unspecified: Secondary | ICD-10-CM | POA: Diagnosis not present

## 2019-12-19 DIAGNOSIS — F0390 Unspecified dementia without behavioral disturbance: Secondary | ICD-10-CM | POA: Diagnosis not present

## 2019-12-20 DIAGNOSIS — N390 Urinary tract infection, site not specified: Secondary | ICD-10-CM | POA: Diagnosis not present

## 2019-12-20 DIAGNOSIS — R221 Localized swelling, mass and lump, neck: Secondary | ICD-10-CM | POA: Diagnosis not present

## 2019-12-20 DIAGNOSIS — Z6824 Body mass index (BMI) 24.0-24.9, adult: Secondary | ICD-10-CM | POA: Diagnosis not present

## 2019-12-20 DIAGNOSIS — F419 Anxiety disorder, unspecified: Secondary | ICD-10-CM | POA: Diagnosis not present

## 2019-12-20 DIAGNOSIS — E785 Hyperlipidemia, unspecified: Secondary | ICD-10-CM | POA: Diagnosis not present

## 2019-12-20 DIAGNOSIS — Z79899 Other long term (current) drug therapy: Secondary | ICD-10-CM | POA: Diagnosis not present

## 2019-12-20 DIAGNOSIS — I10 Essential (primary) hypertension: Secondary | ICD-10-CM | POA: Diagnosis not present

## 2019-12-20 DIAGNOSIS — D649 Anemia, unspecified: Secondary | ICD-10-CM | POA: Diagnosis not present

## 2019-12-21 DIAGNOSIS — E119 Type 2 diabetes mellitus without complications: Secondary | ICD-10-CM | POA: Diagnosis not present

## 2019-12-21 DIAGNOSIS — F419 Anxiety disorder, unspecified: Secondary | ICD-10-CM | POA: Diagnosis not present

## 2019-12-21 DIAGNOSIS — K5790 Diverticulosis of intestine, part unspecified, without perforation or abscess without bleeding: Secondary | ICD-10-CM | POA: Diagnosis not present

## 2019-12-21 DIAGNOSIS — E785 Hyperlipidemia, unspecified: Secondary | ICD-10-CM | POA: Diagnosis not present

## 2019-12-21 DIAGNOSIS — M19032 Primary osteoarthritis, left wrist: Secondary | ICD-10-CM | POA: Diagnosis not present

## 2019-12-21 DIAGNOSIS — I10 Essential (primary) hypertension: Secondary | ICD-10-CM | POA: Diagnosis not present

## 2019-12-21 DIAGNOSIS — E7800 Pure hypercholesterolemia, unspecified: Secondary | ICD-10-CM | POA: Diagnosis not present

## 2019-12-21 DIAGNOSIS — F0390 Unspecified dementia without behavioral disturbance: Secondary | ICD-10-CM | POA: Diagnosis not present

## 2019-12-21 DIAGNOSIS — F329 Major depressive disorder, single episode, unspecified: Secondary | ICD-10-CM | POA: Diagnosis not present

## 2019-12-22 DIAGNOSIS — K56609 Unspecified intestinal obstruction, unspecified as to partial versus complete obstruction: Secondary | ICD-10-CM | POA: Diagnosis not present

## 2019-12-28 DIAGNOSIS — F329 Major depressive disorder, single episode, unspecified: Secondary | ICD-10-CM | POA: Diagnosis not present

## 2019-12-28 DIAGNOSIS — E119 Type 2 diabetes mellitus without complications: Secondary | ICD-10-CM | POA: Diagnosis not present

## 2019-12-28 DIAGNOSIS — F419 Anxiety disorder, unspecified: Secondary | ICD-10-CM | POA: Diagnosis not present

## 2019-12-28 DIAGNOSIS — K5790 Diverticulosis of intestine, part unspecified, without perforation or abscess without bleeding: Secondary | ICD-10-CM | POA: Diagnosis not present

## 2019-12-28 DIAGNOSIS — E785 Hyperlipidemia, unspecified: Secondary | ICD-10-CM | POA: Diagnosis not present

## 2019-12-28 DIAGNOSIS — I10 Essential (primary) hypertension: Secondary | ICD-10-CM | POA: Diagnosis not present

## 2019-12-28 DIAGNOSIS — E7800 Pure hypercholesterolemia, unspecified: Secondary | ICD-10-CM | POA: Diagnosis not present

## 2019-12-28 DIAGNOSIS — F0390 Unspecified dementia without behavioral disturbance: Secondary | ICD-10-CM | POA: Diagnosis not present

## 2019-12-28 DIAGNOSIS — M19032 Primary osteoarthritis, left wrist: Secondary | ICD-10-CM | POA: Diagnosis not present

## 2019-12-29 DIAGNOSIS — E7800 Pure hypercholesterolemia, unspecified: Secondary | ICD-10-CM | POA: Diagnosis not present

## 2019-12-29 DIAGNOSIS — F329 Major depressive disorder, single episode, unspecified: Secondary | ICD-10-CM | POA: Diagnosis not present

## 2019-12-29 DIAGNOSIS — E041 Nontoxic single thyroid nodule: Secondary | ICD-10-CM | POA: Diagnosis not present

## 2019-12-29 DIAGNOSIS — F419 Anxiety disorder, unspecified: Secondary | ICD-10-CM | POA: Diagnosis not present

## 2019-12-29 DIAGNOSIS — I10 Essential (primary) hypertension: Secondary | ICD-10-CM | POA: Diagnosis not present

## 2019-12-29 DIAGNOSIS — K5790 Diverticulosis of intestine, part unspecified, without perforation or abscess without bleeding: Secondary | ICD-10-CM | POA: Diagnosis not present

## 2019-12-29 DIAGNOSIS — M19032 Primary osteoarthritis, left wrist: Secondary | ICD-10-CM | POA: Diagnosis not present

## 2019-12-29 DIAGNOSIS — E785 Hyperlipidemia, unspecified: Secondary | ICD-10-CM | POA: Diagnosis not present

## 2019-12-29 DIAGNOSIS — E119 Type 2 diabetes mellitus without complications: Secondary | ICD-10-CM | POA: Diagnosis not present

## 2019-12-29 DIAGNOSIS — F0390 Unspecified dementia without behavioral disturbance: Secondary | ICD-10-CM | POA: Diagnosis not present

## 2019-12-29 DIAGNOSIS — R221 Localized swelling, mass and lump, neck: Secondary | ICD-10-CM | POA: Diagnosis not present

## 2020-01-01 DIAGNOSIS — F329 Major depressive disorder, single episode, unspecified: Secondary | ICD-10-CM | POA: Diagnosis not present

## 2020-01-01 DIAGNOSIS — F419 Anxiety disorder, unspecified: Secondary | ICD-10-CM | POA: Diagnosis not present

## 2020-01-01 DIAGNOSIS — E7800 Pure hypercholesterolemia, unspecified: Secondary | ICD-10-CM | POA: Diagnosis not present

## 2020-01-01 DIAGNOSIS — M19032 Primary osteoarthritis, left wrist: Secondary | ICD-10-CM | POA: Diagnosis not present

## 2020-01-01 DIAGNOSIS — E119 Type 2 diabetes mellitus without complications: Secondary | ICD-10-CM | POA: Diagnosis not present

## 2020-01-01 DIAGNOSIS — E785 Hyperlipidemia, unspecified: Secondary | ICD-10-CM | POA: Diagnosis not present

## 2020-01-01 DIAGNOSIS — K5790 Diverticulosis of intestine, part unspecified, without perforation or abscess without bleeding: Secondary | ICD-10-CM | POA: Diagnosis not present

## 2020-01-01 DIAGNOSIS — F0390 Unspecified dementia without behavioral disturbance: Secondary | ICD-10-CM | POA: Diagnosis not present

## 2020-01-01 DIAGNOSIS — I10 Essential (primary) hypertension: Secondary | ICD-10-CM | POA: Diagnosis not present

## 2020-01-02 DIAGNOSIS — E119 Type 2 diabetes mellitus without complications: Secondary | ICD-10-CM | POA: Diagnosis not present

## 2020-01-02 DIAGNOSIS — I10 Essential (primary) hypertension: Secondary | ICD-10-CM | POA: Diagnosis not present

## 2020-01-02 DIAGNOSIS — F329 Major depressive disorder, single episode, unspecified: Secondary | ICD-10-CM | POA: Diagnosis not present

## 2020-01-02 DIAGNOSIS — K5790 Diverticulosis of intestine, part unspecified, without perforation or abscess without bleeding: Secondary | ICD-10-CM | POA: Diagnosis not present

## 2020-01-02 DIAGNOSIS — E7800 Pure hypercholesterolemia, unspecified: Secondary | ICD-10-CM | POA: Diagnosis not present

## 2020-01-02 DIAGNOSIS — F419 Anxiety disorder, unspecified: Secondary | ICD-10-CM | POA: Diagnosis not present

## 2020-01-02 DIAGNOSIS — M19032 Primary osteoarthritis, left wrist: Secondary | ICD-10-CM | POA: Diagnosis not present

## 2020-01-02 DIAGNOSIS — E785 Hyperlipidemia, unspecified: Secondary | ICD-10-CM | POA: Diagnosis not present

## 2020-01-02 DIAGNOSIS — F0390 Unspecified dementia without behavioral disturbance: Secondary | ICD-10-CM | POA: Diagnosis not present

## 2020-01-10 DIAGNOSIS — M19032 Primary osteoarthritis, left wrist: Secondary | ICD-10-CM | POA: Diagnosis not present

## 2020-01-10 DIAGNOSIS — F329 Major depressive disorder, single episode, unspecified: Secondary | ICD-10-CM | POA: Diagnosis not present

## 2020-01-10 DIAGNOSIS — E119 Type 2 diabetes mellitus without complications: Secondary | ICD-10-CM | POA: Diagnosis not present

## 2020-01-10 DIAGNOSIS — I10 Essential (primary) hypertension: Secondary | ICD-10-CM | POA: Diagnosis not present

## 2020-01-10 DIAGNOSIS — K5790 Diverticulosis of intestine, part unspecified, without perforation or abscess without bleeding: Secondary | ICD-10-CM | POA: Diagnosis not present

## 2020-01-10 DIAGNOSIS — F419 Anxiety disorder, unspecified: Secondary | ICD-10-CM | POA: Diagnosis not present

## 2020-01-10 DIAGNOSIS — E785 Hyperlipidemia, unspecified: Secondary | ICD-10-CM | POA: Diagnosis not present

## 2020-01-10 DIAGNOSIS — E7800 Pure hypercholesterolemia, unspecified: Secondary | ICD-10-CM | POA: Diagnosis not present

## 2020-01-10 DIAGNOSIS — F0390 Unspecified dementia without behavioral disturbance: Secondary | ICD-10-CM | POA: Diagnosis not present

## 2020-01-11 DIAGNOSIS — E119 Type 2 diabetes mellitus without complications: Secondary | ICD-10-CM | POA: Diagnosis not present

## 2020-01-11 DIAGNOSIS — E7800 Pure hypercholesterolemia, unspecified: Secondary | ICD-10-CM | POA: Diagnosis not present

## 2020-01-11 DIAGNOSIS — F329 Major depressive disorder, single episode, unspecified: Secondary | ICD-10-CM | POA: Diagnosis not present

## 2020-01-11 DIAGNOSIS — I10 Essential (primary) hypertension: Secondary | ICD-10-CM | POA: Diagnosis not present

## 2020-01-11 DIAGNOSIS — F0390 Unspecified dementia without behavioral disturbance: Secondary | ICD-10-CM | POA: Diagnosis not present

## 2020-01-11 DIAGNOSIS — F419 Anxiety disorder, unspecified: Secondary | ICD-10-CM | POA: Diagnosis not present

## 2020-01-11 DIAGNOSIS — M19032 Primary osteoarthritis, left wrist: Secondary | ICD-10-CM | POA: Diagnosis not present

## 2020-01-11 DIAGNOSIS — K5790 Diverticulosis of intestine, part unspecified, without perforation or abscess without bleeding: Secondary | ICD-10-CM | POA: Diagnosis not present

## 2020-01-11 DIAGNOSIS — E785 Hyperlipidemia, unspecified: Secondary | ICD-10-CM | POA: Diagnosis not present

## 2020-01-12 DIAGNOSIS — E785 Hyperlipidemia, unspecified: Secondary | ICD-10-CM | POA: Diagnosis not present

## 2020-01-12 DIAGNOSIS — K5790 Diverticulosis of intestine, part unspecified, without perforation or abscess without bleeding: Secondary | ICD-10-CM | POA: Diagnosis not present

## 2020-01-12 DIAGNOSIS — E119 Type 2 diabetes mellitus without complications: Secondary | ICD-10-CM | POA: Diagnosis not present

## 2020-01-12 DIAGNOSIS — I10 Essential (primary) hypertension: Secondary | ICD-10-CM | POA: Diagnosis not present

## 2020-01-12 DIAGNOSIS — F419 Anxiety disorder, unspecified: Secondary | ICD-10-CM | POA: Diagnosis not present

## 2020-01-12 DIAGNOSIS — M19032 Primary osteoarthritis, left wrist: Secondary | ICD-10-CM | POA: Diagnosis not present

## 2020-01-12 DIAGNOSIS — F0390 Unspecified dementia without behavioral disturbance: Secondary | ICD-10-CM | POA: Diagnosis not present

## 2020-01-12 DIAGNOSIS — F329 Major depressive disorder, single episode, unspecified: Secondary | ICD-10-CM | POA: Diagnosis not present

## 2020-01-12 DIAGNOSIS — E7800 Pure hypercholesterolemia, unspecified: Secondary | ICD-10-CM | POA: Diagnosis not present

## 2020-01-15 DIAGNOSIS — E7800 Pure hypercholesterolemia, unspecified: Secondary | ICD-10-CM | POA: Diagnosis not present

## 2020-01-15 DIAGNOSIS — M19032 Primary osteoarthritis, left wrist: Secondary | ICD-10-CM | POA: Diagnosis not present

## 2020-01-15 DIAGNOSIS — F419 Anxiety disorder, unspecified: Secondary | ICD-10-CM | POA: Diagnosis not present

## 2020-01-15 DIAGNOSIS — F329 Major depressive disorder, single episode, unspecified: Secondary | ICD-10-CM | POA: Diagnosis not present

## 2020-01-15 DIAGNOSIS — E785 Hyperlipidemia, unspecified: Secondary | ICD-10-CM | POA: Diagnosis not present

## 2020-01-15 DIAGNOSIS — F0390 Unspecified dementia without behavioral disturbance: Secondary | ICD-10-CM | POA: Diagnosis not present

## 2020-01-15 DIAGNOSIS — E119 Type 2 diabetes mellitus without complications: Secondary | ICD-10-CM | POA: Diagnosis not present

## 2020-01-15 DIAGNOSIS — I10 Essential (primary) hypertension: Secondary | ICD-10-CM | POA: Diagnosis not present

## 2020-01-15 DIAGNOSIS — K5790 Diverticulosis of intestine, part unspecified, without perforation or abscess without bleeding: Secondary | ICD-10-CM | POA: Diagnosis not present

## 2020-01-17 DIAGNOSIS — F0390 Unspecified dementia without behavioral disturbance: Secondary | ICD-10-CM | POA: Diagnosis not present

## 2020-01-17 DIAGNOSIS — I10 Essential (primary) hypertension: Secondary | ICD-10-CM | POA: Diagnosis not present

## 2020-01-17 DIAGNOSIS — E7800 Pure hypercholesterolemia, unspecified: Secondary | ICD-10-CM | POA: Diagnosis not present

## 2020-01-17 DIAGNOSIS — E785 Hyperlipidemia, unspecified: Secondary | ICD-10-CM | POA: Diagnosis not present

## 2020-01-17 DIAGNOSIS — R1032 Left lower quadrant pain: Secondary | ICD-10-CM | POA: Diagnosis not present

## 2020-01-17 DIAGNOSIS — R1031 Right lower quadrant pain: Secondary | ICD-10-CM | POA: Diagnosis not present

## 2020-01-17 DIAGNOSIS — K5790 Diverticulosis of intestine, part unspecified, without perforation or abscess without bleeding: Secondary | ICD-10-CM | POA: Diagnosis not present

## 2020-01-17 DIAGNOSIS — M19032 Primary osteoarthritis, left wrist: Secondary | ICD-10-CM | POA: Diagnosis not present

## 2020-01-17 DIAGNOSIS — E119 Type 2 diabetes mellitus without complications: Secondary | ICD-10-CM | POA: Diagnosis not present

## 2020-01-17 DIAGNOSIS — F419 Anxiety disorder, unspecified: Secondary | ICD-10-CM | POA: Diagnosis not present

## 2020-01-17 DIAGNOSIS — R109 Unspecified abdominal pain: Secondary | ICD-10-CM | POA: Diagnosis not present

## 2020-01-17 DIAGNOSIS — F329 Major depressive disorder, single episode, unspecified: Secondary | ICD-10-CM | POA: Diagnosis not present

## 2020-01-22 DIAGNOSIS — F329 Major depressive disorder, single episode, unspecified: Secondary | ICD-10-CM | POA: Diagnosis not present

## 2020-01-22 DIAGNOSIS — E785 Hyperlipidemia, unspecified: Secondary | ICD-10-CM | POA: Diagnosis not present

## 2020-01-22 DIAGNOSIS — F0390 Unspecified dementia without behavioral disturbance: Secondary | ICD-10-CM | POA: Diagnosis not present

## 2020-01-22 DIAGNOSIS — M19032 Primary osteoarthritis, left wrist: Secondary | ICD-10-CM | POA: Diagnosis not present

## 2020-01-22 DIAGNOSIS — I10 Essential (primary) hypertension: Secondary | ICD-10-CM | POA: Diagnosis not present

## 2020-01-22 DIAGNOSIS — E119 Type 2 diabetes mellitus without complications: Secondary | ICD-10-CM | POA: Diagnosis not present

## 2020-01-22 DIAGNOSIS — K5790 Diverticulosis of intestine, part unspecified, without perforation or abscess without bleeding: Secondary | ICD-10-CM | POA: Diagnosis not present

## 2020-01-22 DIAGNOSIS — E7800 Pure hypercholesterolemia, unspecified: Secondary | ICD-10-CM | POA: Diagnosis not present

## 2020-01-22 DIAGNOSIS — F419 Anxiety disorder, unspecified: Secondary | ICD-10-CM | POA: Diagnosis not present

## 2020-01-24 DIAGNOSIS — F0390 Unspecified dementia without behavioral disturbance: Secondary | ICD-10-CM | POA: Diagnosis not present

## 2020-01-24 DIAGNOSIS — E7800 Pure hypercholesterolemia, unspecified: Secondary | ICD-10-CM | POA: Diagnosis not present

## 2020-01-24 DIAGNOSIS — F419 Anxiety disorder, unspecified: Secondary | ICD-10-CM | POA: Diagnosis not present

## 2020-01-24 DIAGNOSIS — F329 Major depressive disorder, single episode, unspecified: Secondary | ICD-10-CM | POA: Diagnosis not present

## 2020-01-24 DIAGNOSIS — K5790 Diverticulosis of intestine, part unspecified, without perforation or abscess without bleeding: Secondary | ICD-10-CM | POA: Diagnosis not present

## 2020-01-24 DIAGNOSIS — E119 Type 2 diabetes mellitus without complications: Secondary | ICD-10-CM | POA: Diagnosis not present

## 2020-01-24 DIAGNOSIS — E785 Hyperlipidemia, unspecified: Secondary | ICD-10-CM | POA: Diagnosis not present

## 2020-01-24 DIAGNOSIS — M19032 Primary osteoarthritis, left wrist: Secondary | ICD-10-CM | POA: Diagnosis not present

## 2020-01-24 DIAGNOSIS — I10 Essential (primary) hypertension: Secondary | ICD-10-CM | POA: Diagnosis not present

## 2020-01-31 DIAGNOSIS — E119 Type 2 diabetes mellitus without complications: Secondary | ICD-10-CM | POA: Diagnosis not present

## 2020-01-31 DIAGNOSIS — M19032 Primary osteoarthritis, left wrist: Secondary | ICD-10-CM | POA: Diagnosis not present

## 2020-01-31 DIAGNOSIS — F419 Anxiety disorder, unspecified: Secondary | ICD-10-CM | POA: Diagnosis not present

## 2020-01-31 DIAGNOSIS — E7800 Pure hypercholesterolemia, unspecified: Secondary | ICD-10-CM | POA: Diagnosis not present

## 2020-01-31 DIAGNOSIS — E785 Hyperlipidemia, unspecified: Secondary | ICD-10-CM | POA: Diagnosis not present

## 2020-01-31 DIAGNOSIS — I10 Essential (primary) hypertension: Secondary | ICD-10-CM | POA: Diagnosis not present

## 2020-01-31 DIAGNOSIS — K5790 Diverticulosis of intestine, part unspecified, without perforation or abscess without bleeding: Secondary | ICD-10-CM | POA: Diagnosis not present

## 2020-01-31 DIAGNOSIS — F0390 Unspecified dementia without behavioral disturbance: Secondary | ICD-10-CM | POA: Diagnosis not present

## 2020-01-31 DIAGNOSIS — F329 Major depressive disorder, single episode, unspecified: Secondary | ICD-10-CM | POA: Diagnosis not present

## 2020-02-01 DIAGNOSIS — E785 Hyperlipidemia, unspecified: Secondary | ICD-10-CM | POA: Diagnosis not present

## 2020-02-01 DIAGNOSIS — F419 Anxiety disorder, unspecified: Secondary | ICD-10-CM | POA: Diagnosis not present

## 2020-02-01 DIAGNOSIS — K5790 Diverticulosis of intestine, part unspecified, without perforation or abscess without bleeding: Secondary | ICD-10-CM | POA: Diagnosis not present

## 2020-02-01 DIAGNOSIS — F329 Major depressive disorder, single episode, unspecified: Secondary | ICD-10-CM | POA: Diagnosis not present

## 2020-02-01 DIAGNOSIS — F0390 Unspecified dementia without behavioral disturbance: Secondary | ICD-10-CM | POA: Diagnosis not present

## 2020-02-01 DIAGNOSIS — M19032 Primary osteoarthritis, left wrist: Secondary | ICD-10-CM | POA: Diagnosis not present

## 2020-02-01 DIAGNOSIS — I10 Essential (primary) hypertension: Secondary | ICD-10-CM | POA: Diagnosis not present

## 2020-02-01 DIAGNOSIS — E119 Type 2 diabetes mellitus without complications: Secondary | ICD-10-CM | POA: Diagnosis not present

## 2020-02-01 DIAGNOSIS — E7800 Pure hypercholesterolemia, unspecified: Secondary | ICD-10-CM | POA: Diagnosis not present

## 2020-02-07 DIAGNOSIS — E785 Hyperlipidemia, unspecified: Secondary | ICD-10-CM | POA: Diagnosis not present

## 2020-02-07 DIAGNOSIS — F329 Major depressive disorder, single episode, unspecified: Secondary | ICD-10-CM | POA: Diagnosis not present

## 2020-02-07 DIAGNOSIS — M19032 Primary osteoarthritis, left wrist: Secondary | ICD-10-CM | POA: Diagnosis not present

## 2020-02-07 DIAGNOSIS — K5790 Diverticulosis of intestine, part unspecified, without perforation or abscess without bleeding: Secondary | ICD-10-CM | POA: Diagnosis not present

## 2020-02-07 DIAGNOSIS — E7800 Pure hypercholesterolemia, unspecified: Secondary | ICD-10-CM | POA: Diagnosis not present

## 2020-02-07 DIAGNOSIS — I10 Essential (primary) hypertension: Secondary | ICD-10-CM | POA: Diagnosis not present

## 2020-02-07 DIAGNOSIS — F419 Anxiety disorder, unspecified: Secondary | ICD-10-CM | POA: Diagnosis not present

## 2020-02-07 DIAGNOSIS — F0390 Unspecified dementia without behavioral disturbance: Secondary | ICD-10-CM | POA: Diagnosis not present

## 2020-02-07 DIAGNOSIS — E119 Type 2 diabetes mellitus without complications: Secondary | ICD-10-CM | POA: Diagnosis not present

## 2020-02-29 DIAGNOSIS — E785 Hyperlipidemia, unspecified: Secondary | ICD-10-CM | POA: Diagnosis not present

## 2020-02-29 DIAGNOSIS — I10 Essential (primary) hypertension: Secondary | ICD-10-CM | POA: Diagnosis not present

## 2020-02-29 DIAGNOSIS — F329 Major depressive disorder, single episode, unspecified: Secondary | ICD-10-CM | POA: Diagnosis not present

## 2020-02-29 DIAGNOSIS — Z7189 Other specified counseling: Secondary | ICD-10-CM | POA: Diagnosis not present

## 2020-02-29 DIAGNOSIS — E1169 Type 2 diabetes mellitus with other specified complication: Secondary | ICD-10-CM | POA: Diagnosis not present

## 2020-02-29 DIAGNOSIS — Z79899 Other long term (current) drug therapy: Secondary | ICD-10-CM | POA: Diagnosis not present

## 2020-06-04 DIAGNOSIS — I10 Essential (primary) hypertension: Secondary | ICD-10-CM | POA: Diagnosis not present

## 2020-06-04 DIAGNOSIS — Z6825 Body mass index (BMI) 25.0-25.9, adult: Secondary | ICD-10-CM | POA: Diagnosis not present

## 2020-06-04 DIAGNOSIS — E785 Hyperlipidemia, unspecified: Secondary | ICD-10-CM | POA: Diagnosis not present

## 2020-06-04 DIAGNOSIS — E1169 Type 2 diabetes mellitus with other specified complication: Secondary | ICD-10-CM | POA: Diagnosis not present

## 2020-06-04 DIAGNOSIS — F419 Anxiety disorder, unspecified: Secondary | ICD-10-CM | POA: Diagnosis not present

## 2020-06-04 DIAGNOSIS — Z79899 Other long term (current) drug therapy: Secondary | ICD-10-CM | POA: Diagnosis not present

## 2020-09-30 DIAGNOSIS — Z0000 Encounter for general adult medical examination without abnormal findings: Secondary | ICD-10-CM | POA: Diagnosis not present

## 2020-09-30 DIAGNOSIS — E2839 Other primary ovarian failure: Secondary | ICD-10-CM | POA: Diagnosis not present

## 2020-09-30 DIAGNOSIS — Z7189 Other specified counseling: Secondary | ICD-10-CM | POA: Diagnosis not present

## 2020-09-30 DIAGNOSIS — E1165 Type 2 diabetes mellitus with hyperglycemia: Secondary | ICD-10-CM | POA: Diagnosis not present

## 2020-09-30 DIAGNOSIS — Z6826 Body mass index (BMI) 26.0-26.9, adult: Secondary | ICD-10-CM | POA: Diagnosis not present

## 2020-09-30 DIAGNOSIS — Z1331 Encounter for screening for depression: Secondary | ICD-10-CM | POA: Diagnosis not present

## 2020-09-30 DIAGNOSIS — Z1339 Encounter for screening examination for other mental health and behavioral disorders: Secondary | ICD-10-CM | POA: Diagnosis not present

## 2020-10-09 DIAGNOSIS — E876 Hypokalemia: Secondary | ICD-10-CM | POA: Diagnosis not present

## 2020-10-09 DIAGNOSIS — K5790 Diverticulosis of intestine, part unspecified, without perforation or abscess without bleeding: Secondary | ICD-10-CM | POA: Diagnosis not present

## 2020-10-09 DIAGNOSIS — F419 Anxiety disorder, unspecified: Secondary | ICD-10-CM | POA: Diagnosis not present

## 2020-10-09 DIAGNOSIS — R072 Precordial pain: Secondary | ICD-10-CM | POA: Diagnosis not present

## 2020-10-09 DIAGNOSIS — R079 Chest pain, unspecified: Secondary | ICD-10-CM | POA: Diagnosis not present

## 2020-10-09 DIAGNOSIS — E119 Type 2 diabetes mellitus without complications: Secondary | ICD-10-CM | POA: Diagnosis not present

## 2020-10-09 DIAGNOSIS — I249 Acute ischemic heart disease, unspecified: Secondary | ICD-10-CM | POA: Diagnosis not present

## 2020-10-09 DIAGNOSIS — I10 Essential (primary) hypertension: Secondary | ICD-10-CM | POA: Diagnosis not present

## 2020-10-09 DIAGNOSIS — E785 Hyperlipidemia, unspecified: Secondary | ICD-10-CM | POA: Diagnosis not present

## 2020-10-09 DIAGNOSIS — F32A Depression, unspecified: Secondary | ICD-10-CM | POA: Diagnosis not present

## 2020-10-09 DIAGNOSIS — K219 Gastro-esophageal reflux disease without esophagitis: Secondary | ICD-10-CM | POA: Diagnosis not present

## 2020-10-09 DIAGNOSIS — Z23 Encounter for immunization: Secondary | ICD-10-CM | POA: Diagnosis not present

## 2020-10-10 DIAGNOSIS — E785 Hyperlipidemia, unspecified: Secondary | ICD-10-CM | POA: Diagnosis not present

## 2020-10-10 DIAGNOSIS — I249 Acute ischemic heart disease, unspecified: Secondary | ICD-10-CM | POA: Diagnosis not present

## 2020-10-10 DIAGNOSIS — R079 Chest pain, unspecified: Secondary | ICD-10-CM | POA: Diagnosis not present

## 2020-10-10 DIAGNOSIS — I10 Essential (primary) hypertension: Secondary | ICD-10-CM | POA: Diagnosis not present

## 2020-10-11 DIAGNOSIS — R079 Chest pain, unspecified: Secondary | ICD-10-CM | POA: Diagnosis not present

## 2020-10-11 DIAGNOSIS — I10 Essential (primary) hypertension: Secondary | ICD-10-CM | POA: Diagnosis not present

## 2020-10-11 DIAGNOSIS — E785 Hyperlipidemia, unspecified: Secondary | ICD-10-CM | POA: Diagnosis not present

## 2020-10-11 DIAGNOSIS — I249 Acute ischemic heart disease, unspecified: Secondary | ICD-10-CM | POA: Diagnosis not present

## 2020-10-15 DIAGNOSIS — E1169 Type 2 diabetes mellitus with other specified complication: Secondary | ICD-10-CM | POA: Diagnosis not present

## 2020-10-15 DIAGNOSIS — Z09 Encounter for follow-up examination after completed treatment for conditions other than malignant neoplasm: Secondary | ICD-10-CM | POA: Diagnosis not present

## 2020-10-15 DIAGNOSIS — I10 Essential (primary) hypertension: Secondary | ICD-10-CM | POA: Diagnosis not present

## 2020-10-15 DIAGNOSIS — I700 Atherosclerosis of aorta: Secondary | ICD-10-CM | POA: Diagnosis not present

## 2020-10-15 DIAGNOSIS — I25118 Atherosclerotic heart disease of native coronary artery with other forms of angina pectoris: Secondary | ICD-10-CM | POA: Diagnosis not present

## 2020-10-15 DIAGNOSIS — F329 Major depressive disorder, single episode, unspecified: Secondary | ICD-10-CM | POA: Diagnosis not present

## 2020-10-15 DIAGNOSIS — N1832 Chronic kidney disease, stage 3b: Secondary | ICD-10-CM | POA: Diagnosis not present

## 2020-10-15 DIAGNOSIS — K219 Gastro-esophageal reflux disease without esophagitis: Secondary | ICD-10-CM | POA: Diagnosis not present

## 2020-12-03 DIAGNOSIS — E785 Hyperlipidemia, unspecified: Secondary | ICD-10-CM | POA: Diagnosis not present

## 2020-12-03 DIAGNOSIS — F331 Major depressive disorder, recurrent, moderate: Secondary | ICD-10-CM | POA: Diagnosis not present

## 2020-12-03 DIAGNOSIS — F419 Anxiety disorder, unspecified: Secondary | ICD-10-CM | POA: Diagnosis not present

## 2020-12-03 DIAGNOSIS — I10 Essential (primary) hypertension: Secondary | ICD-10-CM | POA: Diagnosis not present

## 2020-12-03 DIAGNOSIS — I700 Atherosclerosis of aorta: Secondary | ICD-10-CM | POA: Diagnosis not present

## 2020-12-03 DIAGNOSIS — Z6826 Body mass index (BMI) 26.0-26.9, adult: Secondary | ICD-10-CM | POA: Diagnosis not present

## 2021-01-14 DIAGNOSIS — F419 Anxiety disorder, unspecified: Secondary | ICD-10-CM | POA: Diagnosis not present

## 2021-01-14 DIAGNOSIS — Z79899 Other long term (current) drug therapy: Secondary | ICD-10-CM | POA: Diagnosis not present

## 2021-01-14 DIAGNOSIS — E1169 Type 2 diabetes mellitus with other specified complication: Secondary | ICD-10-CM | POA: Diagnosis not present

## 2021-01-14 DIAGNOSIS — E785 Hyperlipidemia, unspecified: Secondary | ICD-10-CM | POA: Diagnosis not present

## 2021-01-14 DIAGNOSIS — I10 Essential (primary) hypertension: Secondary | ICD-10-CM | POA: Diagnosis not present

## 2021-01-14 DIAGNOSIS — S0003XA Contusion of scalp, initial encounter: Secondary | ICD-10-CM | POA: Diagnosis not present

## 2021-02-01 DIAGNOSIS — R112 Nausea with vomiting, unspecified: Secondary | ICD-10-CM | POA: Diagnosis not present

## 2021-02-01 DIAGNOSIS — R197 Diarrhea, unspecified: Secondary | ICD-10-CM | POA: Diagnosis not present

## 2021-02-01 DIAGNOSIS — K6389 Other specified diseases of intestine: Secondary | ICD-10-CM | POA: Diagnosis not present

## 2021-02-01 DIAGNOSIS — R1110 Vomiting, unspecified: Secondary | ICD-10-CM | POA: Diagnosis not present

## 2021-02-01 DIAGNOSIS — K6289 Other specified diseases of anus and rectum: Secondary | ICD-10-CM | POA: Diagnosis not present

## 2021-02-01 DIAGNOSIS — R1084 Generalized abdominal pain: Secondary | ICD-10-CM | POA: Diagnosis not present

## 2022-03-29 DIAGNOSIS — R079 Chest pain, unspecified: Secondary | ICD-10-CM | POA: Diagnosis not present

## 2023-06-06 ENCOUNTER — Other Ambulatory Visit: Payer: Self-pay

## 2023-06-06 ENCOUNTER — Emergency Department (HOSPITAL_COMMUNITY): Payer: Medicare HMO

## 2023-06-06 ENCOUNTER — Inpatient Hospital Stay (HOSPITAL_COMMUNITY)
Admission: EM | Admit: 2023-06-06 | Discharge: 2023-06-09 | DRG: 177 | Disposition: A | Discharge status: Home Health | Payer: Medicare HMO | Attending: Internal Medicine | Admitting: Internal Medicine

## 2023-06-06 DIAGNOSIS — B9620 Unspecified Escherichia coli [E. coli] as the cause of diseases classified elsewhere: Secondary | ICD-10-CM | POA: Diagnosis present

## 2023-06-06 DIAGNOSIS — Z8619 Personal history of other infectious and parasitic diseases: Secondary | ICD-10-CM

## 2023-06-06 DIAGNOSIS — E876 Hypokalemia: Secondary | ICD-10-CM | POA: Diagnosis present

## 2023-06-06 DIAGNOSIS — Z888 Allergy status to other drugs, medicaments and biological substances status: Secondary | ICD-10-CM

## 2023-06-06 DIAGNOSIS — N1832 Chronic kidney disease, stage 3b: Secondary | ICD-10-CM | POA: Diagnosis present

## 2023-06-06 DIAGNOSIS — Z7984 Long term (current) use of oral hypoglycemic drugs: Secondary | ICD-10-CM

## 2023-06-06 DIAGNOSIS — K219 Gastro-esophageal reflux disease without esophagitis: Secondary | ICD-10-CM | POA: Diagnosis present

## 2023-06-06 DIAGNOSIS — E785 Hyperlipidemia, unspecified: Secondary | ICD-10-CM | POA: Diagnosis present

## 2023-06-06 DIAGNOSIS — A419 Sepsis, unspecified organism: Principal | ICD-10-CM

## 2023-06-06 DIAGNOSIS — Z91041 Radiographic dye allergy status: Secondary | ICD-10-CM

## 2023-06-06 DIAGNOSIS — I10 Essential (primary) hypertension: Secondary | ICD-10-CM | POA: Insufficient documentation

## 2023-06-06 DIAGNOSIS — Z79899 Other long term (current) drug therapy: Secondary | ICD-10-CM

## 2023-06-06 DIAGNOSIS — U071 COVID-19: Principal | ICD-10-CM | POA: Diagnosis present

## 2023-06-06 DIAGNOSIS — Z7982 Long term (current) use of aspirin: Secondary | ICD-10-CM

## 2023-06-06 DIAGNOSIS — I129 Hypertensive chronic kidney disease with stage 1 through stage 4 chronic kidney disease, or unspecified chronic kidney disease: Secondary | ICD-10-CM | POA: Diagnosis present

## 2023-06-06 DIAGNOSIS — Z91013 Allergy to seafood: Secondary | ICD-10-CM

## 2023-06-06 DIAGNOSIS — F32A Depression, unspecified: Secondary | ICD-10-CM | POA: Diagnosis present

## 2023-06-06 DIAGNOSIS — G9341 Metabolic encephalopathy: Secondary | ICD-10-CM | POA: Diagnosis present

## 2023-06-06 DIAGNOSIS — E039 Hypothyroidism, unspecified: Secondary | ICD-10-CM | POA: Diagnosis present

## 2023-06-06 DIAGNOSIS — E1122 Type 2 diabetes mellitus with diabetic chronic kidney disease: Secondary | ICD-10-CM | POA: Diagnosis present

## 2023-06-06 DIAGNOSIS — F419 Anxiety disorder, unspecified: Secondary | ICD-10-CM | POA: Diagnosis present

## 2023-06-06 DIAGNOSIS — Z79890 Hormone replacement therapy (postmenopausal): Secondary | ICD-10-CM

## 2023-06-06 DIAGNOSIS — R4182 Altered mental status, unspecified: Secondary | ICD-10-CM | POA: Diagnosis not present

## 2023-06-06 DIAGNOSIS — Z885 Allergy status to narcotic agent status: Secondary | ICD-10-CM

## 2023-06-06 DIAGNOSIS — N390 Urinary tract infection, site not specified: Secondary | ICD-10-CM | POA: Diagnosis present

## 2023-06-06 DIAGNOSIS — E119 Type 2 diabetes mellitus without complications: Secondary | ICD-10-CM

## 2023-06-06 LAB — CBC WITH DIFFERENTIAL/PLATELET
Abs Immature Granulocytes: 0.02 10*3/uL (ref 0.00–0.07)
Basophils Absolute: 0.1 10*3/uL (ref 0.0–0.1)
Basophils Relative: 1 %
Eosinophils Absolute: 0.1 10*3/uL (ref 0.0–0.5)
Eosinophils Relative: 1 %
HCT: 37.8 % (ref 36.0–46.0)
Hemoglobin: 12.3 g/dL (ref 12.0–15.0)
Immature Granulocytes: 0 %
Lymphocytes Relative: 13 %
Lymphs Abs: 0.8 10*3/uL (ref 0.7–4.0)
MCH: 29.8 pg (ref 26.0–34.0)
MCHC: 32.5 g/dL (ref 30.0–36.0)
MCV: 91.5 fL (ref 80.0–100.0)
Monocytes Absolute: 0.7 10*3/uL (ref 0.1–1.0)
Monocytes Relative: 11 %
Neutro Abs: 4.7 10*3/uL (ref 1.7–7.7)
Neutrophils Relative %: 74 %
Platelets: 181 10*3/uL (ref 150–400)
RBC: 4.13 MIL/uL (ref 3.87–5.11)
RDW: 12.9 % (ref 11.5–15.5)
WBC: 6.3 10*3/uL (ref 4.0–10.5)
nRBC: 0 % (ref 0.0–0.2)

## 2023-06-06 LAB — URINALYSIS, W/ REFLEX TO CULTURE (INFECTION SUSPECTED)
Bilirubin Urine: NEGATIVE
Glucose, UA: NEGATIVE mg/dL
Ketones, ur: NEGATIVE mg/dL
Nitrite: NEGATIVE
Protein, ur: 30 mg/dL — AB
Specific Gravity, Urine: 1.015 (ref 1.005–1.030)
WBC, UA: >50 WBC/hpf (ref 0–5)
pH: 6 (ref 5.0–8.0)

## 2023-06-06 LAB — COMPREHENSIVE METABOLIC PANEL
ALT: 15 U/L (ref 0–44)
AST: 23 U/L (ref 15–41)
Albumin: 3.5 g/dL (ref 3.5–5.0)
Alkaline Phosphatase: 58 U/L (ref 38–126)
Anion gap: 12 (ref 5–15)
BUN: 31 mg/dL — ABNORMAL HIGH (ref 8–23)
CO2: 22 mmol/L (ref 22–32)
Calcium: 9.6 mg/dL (ref 8.9–10.3)
Chloride: 101 mmol/L (ref 98–111)
Creatinine, Ser: 1.3 mg/dL — ABNORMAL HIGH (ref 0.44–1.00)
GFR, Estimated: 40 mL/min — ABNORMAL LOW (ref >60)
Glucose, Bld: 208 mg/dL — ABNORMAL HIGH (ref 70–99)
Potassium: 4 mmol/L (ref 3.5–5.1)
Sodium: 135 mmol/L (ref 135–145)
Total Bilirubin: 1.3 mg/dL — ABNORMAL HIGH (ref 0.3–1.2)
Total Protein: 6.9 g/dL (ref 6.5–8.1)

## 2023-06-06 LAB — I-STAT CG4 LACTIC ACID, ED
Lactic Acid, Venous: 2.3 mmol/L (ref 0.5–1.9)
Lactic Acid, Venous: 0.7 mmol/L (ref 0.5–1.9)

## 2023-06-06 LAB — I-STAT CHEM 8, ED
BUN: 38 mg/dL — ABNORMAL HIGH (ref 8–23)
Calcium, Ion: 1.18 mmol/L (ref 1.15–1.40)
Chloride: 102 mmol/L (ref 98–111)
Creatinine, Ser: 1.3 mg/dL — ABNORMAL HIGH (ref 0.44–1.00)
Glucose, Bld: 208 mg/dL — ABNORMAL HIGH (ref 70–99)
HCT: 36 % (ref 36.0–46.0)
Hemoglobin: 12.2 g/dL (ref 12.0–15.0)
Potassium: 4.5 mmol/L (ref 3.5–5.1)
Sodium: 136 mmol/L (ref 135–145)
TCO2: 24 mmol/L (ref 22–32)

## 2023-06-06 LAB — RESP PANEL BY RT-PCR (RSV, FLU A&B, COVID)  RVPGX2
Influenza A by PCR: NEGATIVE
Influenza B by PCR: NEGATIVE
Resp Syncytial Virus by PCR: NEGATIVE
SARS Coronavirus 2 by RT PCR: POSITIVE — AB

## 2023-06-06 LAB — LIPASE, BLOOD: Lipase: 35 U/L (ref 11–51)

## 2023-06-06 LAB — APTT: aPTT: 23 seconds — ABNORMAL LOW (ref 24–36)

## 2023-06-06 LAB — PROTIME-INR
INR: 1.1 (ref 0.8–1.2)
Prothrombin Time: 14 seconds (ref 11.4–15.2)

## 2023-06-06 MED ORDER — SODIUM CHLORIDE 0.9 % IV SOLN
2.0000 g | Freq: Once | INTRAVENOUS | Status: AC
  Administered 2023-06-06: 2 g via INTRAVENOUS
  Filled 2023-06-06: qty 12.5

## 2023-06-06 MED ORDER — ACETAMINOPHEN 650 MG RE SUPP
650.0000 mg | Freq: Once | RECTAL | Status: AC
  Administered 2023-06-06: 650 mg via RECTAL
  Filled 2023-06-06: qty 1

## 2023-06-06 MED ORDER — LACTATED RINGERS IV BOLUS (SEPSIS)
1000.0000 mL | Freq: Once | INTRAVENOUS | Status: AC
  Administered 2023-06-06: 1000 mL via INTRAVENOUS

## 2023-06-06 MED ORDER — VANCOMYCIN HCL 1250 MG/250ML IV SOLN
1250.0000 mg | Freq: Once | INTRAVENOUS | Status: AC
  Administered 2023-06-06: 1250 mg via INTRAVENOUS
  Filled 2023-06-06: qty 250

## 2023-06-06 MED ORDER — LACTATED RINGERS IV SOLN: INTRAVENOUS | Status: DC

## 2023-06-06 MED ORDER — VANCOMYCIN HCL IN DEXTROSE 1-5 GM/200ML-% IV SOLN: 1000.0000 mg | Freq: Once | INTRAVENOUS | Status: DC

## 2023-06-06 MED ORDER — METRONIDAZOLE 500 MG/100ML IV SOLN
500.0000 mg | Freq: Once | INTRAVENOUS | Status: AC
  Administered 2023-06-06: 500 mg via INTRAVENOUS
  Filled 2023-06-06: qty 100

## 2023-06-06 NOTE — ED Triage Notes (Signed)
Pt presents to ED with altered mental status. Pts grandson states that normally she is A/Ox4, and able to ambulate to the bathroom on her own, but this morning she was found to be altered, didn't eat much like she normally does, and at church she wasn't her normal self, she is responding to stimuli and states her lower abdomen is hurting. Pts grandson did mention that she had one episode of emesis today and noticed a redness in her inguinal area which was not there two days ago.

## 2023-06-06 NOTE — ED Provider Notes (Signed)
I saw and evaluated the patient, reviewed the resident's note and I agree with the findings and plan.  EKG Interpretation Date/Time:  Sunday June 06 2023 16:48:34 EDT Ventricular Rate:  80 PR Interval:  216 QRS Duration:  100 QT Interval:  320 QTC Calculation: 370 R Axis:   -35  Text Interpretation: Sinus rhythm Borderline prolonged PR interval Left axis deviation Low voltage, precordial leads Borderline repolarization abnormality Confirmed by Lorre Nick ([id number]) on 06/06/2023 5:35:03 PM   Patient is EKG per interpretation shows sinus rhythm.  Patient presented with altered middle status.  According to grandson at bedside she is normally alert and oriented x 4.  Found be febrile here with temp of 102.7.  Concern for sepsis.  Sepsis protocol initiated.  Patient will require admission   Lorre Nick, MD 06/06/23 1736

## 2023-06-06 NOTE — Progress Notes (Signed)
ED Pharmacy Antibiotic Sign Off An antibiotic consult was received from an ED provider for vancomycin and cefepime per pharmacy dosing for sepsis. A chart review was completed to assess appropriateness.   The following one time order(s) were placed:  Vancomycin 1250 mg IV x 1 Cefepime 2g IV x 1  Further antibiotic and/or antibiotic pharmacy consults should be ordered by the admitting provider if indicated.   Thank you for allowing pharmacy to be a part of this patient's care.   Daylene Posey, [facility]  Clinical Pharmacist 06/06/23 4:59 PM

## 2023-06-06 NOTE — Progress Notes (Signed)
eLINK following for sepsis protocol. ?

## 2023-06-06 NOTE — ED Provider Notes (Signed)
[place] EMERGENCY DEPARTMENT AT [facility] Provider Note   CSN: [id number] Arrival date & time: 06/06/23  1609     History  Chief Complaint  Patient presents with   Altered Mental Status   Emesis    Andrea Gordon is a 88 y.o. female.  Patient is an 88 year old female with past medical history of***, presenting today for concerns of altered mental status.  They note that she was well last night.  This morning, she was complaining of not feeling well.  She did not show up for church.  Per family, she has not been able to get out of the bed and is confused.  She has been moaning in pain.  They deny any known fevers at home.  She has had a nonproductive cough.  She denies any chest pain or shortness of breath.  She did have 1 episode of vomiting while in route to the ED.  She denies any constipation or diarrhea.  She has admitted to dysuria.  She is family contacts with COVID over the last week.     Home Medications Prior to Admission medications   Medication Sig Start Date End Date Taking? Authorizing Provider  aspirin EC 81 MG tablet Take 81 mg by mouth daily.    [provider]  atenolol-chlorthalidone (TENORETIC) 50-25 MG per tablet Take 1 tablet by mouth daily.    [provider]  docusate sodium (COLACE) 100 MG capsule Take 1 capsule (100 mg total) by mouth 2 (two) times daily. 01/09/18   Tressie Stalker, MD  DULoxetine (CYMBALTA) 20 MG capsule Take 20 mg by mouth daily.    [provider]  gabapentin (NEURONTIN) 100 MG capsule Take 100 mg by mouth 3 (three) times daily.    [provider]  insulin aspart protamine-insulin aspart (NOVOLOG 70/30) (70-30) 100 UNIT/ML injection Inject 30-35 Units into the skin 2 (two) times daily with a meal. 35 units in the morning before breakfast & 30 units in the evening before supper    [provider]  potassium chloride (K-DUR) 10 MEQ tablet Take 10 mEq by mouth daily.    [provider]  pravastatin (PRAVACHOL) 10 MG tablet Take 10 mg by mouth every evening.     [provider]  ranitidine (ZANTAC) 150 MG tablet Take 150 mg by mouth 2 (two) times daily.    [provider]  Vitamin D, Ergocalciferol, (DRISDOL) 50000 units CAPS capsule Take 50,000 Units by mouth every Friday.    [provider]      Allergies    Shellfish allergy, Codeine, Contrast media [iodinated contrast media], and Omeprazole    Review of Systems   Negative except for as noted above in HPI  Physical Exam Updated Vital Signs BP (!) 136/93 (BP Location: Right Arm)   Pulse 77   Temp (!) 102.7 F (39.3 C) (Rectal)   Resp 18   Wt 61.2 kg   SpO2 97%   BMI 24.69 kg/m  Physical Exam Vitals and nursing note reviewed.  Constitutional:      Appearance: She is well-developed. She is ill-appearing.  HENT:     Head: Normocephalic and atraumatic.     Mouth/Throat:     Mouth: Mucous membranes are dry.  Eyes:     Conjunctiva/sclera: Conjunctivae normal.  Cardiovascular:     Rate and Rhythm: Normal rate and regular rhythm.     Heart sounds: No murmur heard.    No friction rub.  Pulmonary:  Effort: Pulmonary effort is normal. No respiratory distress.     Breath sounds: Rales (right basilar rales) present.  Abdominal:     General: There is no distension.     Palpations: Abdomen is soft.     Tenderness: There is abdominal tenderness (diffuse abdominal tenderness). There is no guarding.  Musculoskeletal:        General: No swelling or tenderness.     Cervical back: Neck supple.  Skin:    General: Skin is warm and dry.     Capillary Refill: Capillary refill takes less than 2 seconds.     Findings: No rash.  Neurological:     Comments: Oriented to self  Psychiatric:        Mood and Affect: Mood normal.     ED Results / Procedures / Treatments   Labs (all labs ordered are listed, but only abnormal results are displayed) Labs Reviewed - No data to  display  EKG None  Radiology No results found.  Procedures Procedures  {Document cardiac monitor, telemetry assessment procedure when appropriate:1}  Medications Ordered in ED Medications - No data to display  ED Course/ Medical Decision Making/ A&P   {   Click here for ABCD2, HEART and other calculatorsREFRESH Note before signing :1}                              Medical Decision Making  *** Patient is an 88 year old female with past medical history of***, presenting today for concerns of altered mental status.  On exam, patient is ill-appearing and moderate distress secondary to generalized pain.  She is febrile to 102.7.  She is in regular rate and rhythm.  She has right basilar crackles present but saturating well on room air.  She has diffuse abdominal tenderness.  Presentation concerning for sepsis given fever and altered mental status.  Will evaluate for UTI, pyelonephritis, COVID, pneumonia, intra-abdominal process.  Sepsis protocol initiated with IV fluids and broad-spectrum antibiotics.  EKG reveals sinus rhythm at 80 bpm with T wave inversions present in V4 through V6  {Document critical care time when appropriate:1} {Document review of labs and clinical decision tools ie heart score, Chads2Vasc2 etc:1}  {Document your independent review of radiology images, and any outside records:1} {Document your discussion with family members, caretakers, and with consultants:1} {Document social determinants of health affecting pt's care:1} {Document your decision making why or why not admission, treatments were needed:1} Final Clinical Impression(s) / ED Diagnoses Final diagnoses:  None    Rx / DC Orders ED Discharge Orders     None

## 2023-06-07 ENCOUNTER — Inpatient Hospital Stay (HOSPITAL_COMMUNITY): Payer: Medicare HMO

## 2023-06-07 DIAGNOSIS — Z79890 Hormone replacement therapy (postmenopausal): Secondary | ICD-10-CM | POA: Diagnosis not present

## 2023-06-07 DIAGNOSIS — N3000 Acute cystitis without hematuria: Secondary | ICD-10-CM

## 2023-06-07 DIAGNOSIS — Z7984 Long term (current) use of oral hypoglycemic drugs: Secondary | ICD-10-CM | POA: Diagnosis not present

## 2023-06-07 DIAGNOSIS — N1832 Chronic kidney disease, stage 3b: Secondary | ICD-10-CM | POA: Diagnosis present

## 2023-06-07 DIAGNOSIS — E039 Hypothyroidism, unspecified: Secondary | ICD-10-CM | POA: Insufficient documentation

## 2023-06-07 DIAGNOSIS — A419 Sepsis, unspecified organism: Secondary | ICD-10-CM | POA: Diagnosis not present

## 2023-06-07 DIAGNOSIS — Z91013 Allergy to seafood: Secondary | ICD-10-CM | POA: Diagnosis not present

## 2023-06-07 DIAGNOSIS — Z7982 Long term (current) use of aspirin: Secondary | ICD-10-CM | POA: Diagnosis not present

## 2023-06-07 DIAGNOSIS — F32A Depression, unspecified: Secondary | ICD-10-CM | POA: Diagnosis present

## 2023-06-07 DIAGNOSIS — F419 Anxiety disorder, unspecified: Secondary | ICD-10-CM | POA: Insufficient documentation

## 2023-06-07 DIAGNOSIS — U071 COVID-19: Secondary | ICD-10-CM | POA: Diagnosis present

## 2023-06-07 DIAGNOSIS — Z79899 Other long term (current) drug therapy: Secondary | ICD-10-CM | POA: Diagnosis not present

## 2023-06-07 DIAGNOSIS — E1122 Type 2 diabetes mellitus with diabetic chronic kidney disease: Secondary | ICD-10-CM | POA: Diagnosis present

## 2023-06-07 DIAGNOSIS — E785 Hyperlipidemia, unspecified: Secondary | ICD-10-CM | POA: Diagnosis present

## 2023-06-07 DIAGNOSIS — G9341 Metabolic encephalopathy: Secondary | ICD-10-CM | POA: Diagnosis present

## 2023-06-07 DIAGNOSIS — Z91041 Radiographic dye allergy status: Secondary | ICD-10-CM | POA: Diagnosis not present

## 2023-06-07 DIAGNOSIS — Z885 Allergy status to narcotic agent status: Secondary | ICD-10-CM | POA: Diagnosis not present

## 2023-06-07 DIAGNOSIS — N390 Urinary tract infection, site not specified: Secondary | ICD-10-CM | POA: Diagnosis present

## 2023-06-07 DIAGNOSIS — R4182 Altered mental status, unspecified: Secondary | ICD-10-CM | POA: Diagnosis present

## 2023-06-07 DIAGNOSIS — E876 Hypokalemia: Secondary | ICD-10-CM | POA: Diagnosis present

## 2023-06-07 DIAGNOSIS — K219 Gastro-esophageal reflux disease without esophagitis: Secondary | ICD-10-CM | POA: Diagnosis present

## 2023-06-07 DIAGNOSIS — B9620 Unspecified Escherichia coli [E. coli] as the cause of diseases classified elsewhere: Secondary | ICD-10-CM | POA: Diagnosis present

## 2023-06-07 DIAGNOSIS — I129 Hypertensive chronic kidney disease with stage 1 through stage 4 chronic kidney disease, or unspecified chronic kidney disease: Secondary | ICD-10-CM | POA: Diagnosis present

## 2023-06-07 DIAGNOSIS — Z8619 Personal history of other infectious and parasitic diseases: Secondary | ICD-10-CM | POA: Diagnosis not present

## 2023-06-07 DIAGNOSIS — I10 Essential (primary) hypertension: Secondary | ICD-10-CM | POA: Insufficient documentation

## 2023-06-07 DIAGNOSIS — E119 Type 2 diabetes mellitus without complications: Secondary | ICD-10-CM

## 2023-06-07 DIAGNOSIS — Z888 Allergy status to other drugs, medicaments and biological substances status: Secondary | ICD-10-CM | POA: Diagnosis not present

## 2023-06-07 LAB — CBC
HCT: 33.4 % — ABNORMAL LOW (ref 36.0–46.0)
Hemoglobin: 10.9 g/dL — ABNORMAL LOW (ref 12.0–15.0)
MCH: 29.9 pg (ref 26.0–34.0)
MCHC: 32.6 g/dL (ref 30.0–36.0)
MCV: 91.5 fL (ref 80.0–100.0)
Platelets: 139 10*3/uL — ABNORMAL LOW (ref 150–400)
RBC: 3.65 MIL/uL — ABNORMAL LOW (ref 3.87–5.11)
RDW: 12.9 % (ref 11.5–15.5)
WBC: 3.9 10*3/uL — ABNORMAL LOW (ref 4.0–10.5)
nRBC: 0 % (ref 0.0–0.2)

## 2023-06-07 LAB — COMPREHENSIVE METABOLIC PANEL
ALT: 12 U/L (ref 0–44)
AST: 16 U/L (ref 15–41)
Albumin: 3 g/dL — ABNORMAL LOW (ref 3.5–5.0)
Alkaline Phosphatase: 49 U/L (ref 38–126)
Anion gap: 11 (ref 5–15)
BUN: 25 mg/dL — ABNORMAL HIGH (ref 8–23)
CO2: 23 mmol/L (ref 22–32)
Calcium: 9.1 mg/dL (ref 8.9–10.3)
Chloride: 102 mmol/L (ref 98–111)
Creatinine, Ser: 1.22 mg/dL — ABNORMAL HIGH (ref 0.44–1.00)
GFR, Estimated: 43 mL/min — ABNORMAL LOW (ref >60)
Glucose, Bld: 127 mg/dL — ABNORMAL HIGH (ref 70–99)
Potassium: 3.1 mmol/L — ABNORMAL LOW (ref 3.5–5.1)
Sodium: 136 mmol/L (ref 135–145)
Total Bilirubin: 0.9 mg/dL (ref 0.3–1.2)
Total Protein: 5.9 g/dL — ABNORMAL LOW (ref 6.5–8.1)

## 2023-06-07 LAB — GLUCOSE, CAPILLARY
Glucose-Capillary: 117 mg/dL — ABNORMAL HIGH (ref 70–99)
Glucose-Capillary: 135 mg/dL — ABNORMAL HIGH (ref 70–99)
Glucose-Capillary: 101 mg/dL — ABNORMAL HIGH (ref 70–99)

## 2023-06-07 LAB — MAGNESIUM: Magnesium: 1.3 mg/dL — ABNORMAL LOW (ref 1.7–2.4)

## 2023-06-07 LAB — HEMOGLOBIN A1C
Hgb A1c MFr Bld: 7.5 % — ABNORMAL HIGH (ref 4.8–5.6)
Mean Plasma Glucose: 168.55 mg/dL

## 2023-06-07 LAB — PHOSPHORUS: Phosphorus: 2.5 mg/dL (ref 2.5–4.6)

## 2023-06-07 MED ORDER — ASPIRIN 81 MG PO TBEC
81.0000 mg | DELAYED_RELEASE_TABLET | Freq: Every day | ORAL | Status: DC
  Administered 2023-06-07 – 2023-06-09 (×3): 81 mg via ORAL
  Filled 2023-06-07 (×3): qty 1

## 2023-06-07 MED ORDER — POTASSIUM CHLORIDE 20 MEQ PO PACK
40.0000 meq | PACK | Freq: Two times a day (BID) | ORAL | Status: AC
  Administered 2023-06-07 (×2): 40 meq via ORAL
  Filled 2023-06-07 (×2): qty 2

## 2023-06-07 MED ORDER — MAGNESIUM SULFATE 2 GM/50ML IV SOLN
2.0000 g | Freq: Once | INTRAVENOUS | Status: AC
  Administered 2023-06-07: 2 g via INTRAVENOUS
  Filled 2023-06-07: qty 50

## 2023-06-07 MED ORDER — SODIUM CHLORIDE 0.9 % IV SOLN
2.0000 g | INTRAVENOUS | Status: DC
  Administered 2023-06-07: 2 g via INTRAVENOUS
  Filled 2023-06-07: qty 20

## 2023-06-07 MED ORDER — PANTOPRAZOLE SODIUM 40 MG PO TBEC
40.0000 mg | DELAYED_RELEASE_TABLET | Freq: Every day | ORAL | Status: DC
  Administered 2023-06-07 – 2023-06-09 (×3): 40 mg via ORAL
  Filled 2023-06-07 (×3): qty 1

## 2023-06-07 MED ORDER — ALBUTEROL SULFATE (2.5 MG/3ML) 0.083% IN NEBU: 2.5000 mg | INHALATION_SOLUTION | RESPIRATORY_TRACT | Status: DC | PRN

## 2023-06-07 MED ORDER — ACETAMINOPHEN 325 MG PO TABS
650.0000 mg | ORAL_TABLET | Freq: Four times a day (QID) | ORAL | Status: DC | PRN
  Administered 2023-06-08: 650 mg via ORAL
  Filled 2023-06-07 (×2): qty 2

## 2023-06-07 MED ORDER — INSULIN ASPART 100 UNIT/ML IJ SOLN: 0.0000 [IU] | Freq: Every day | INTRAMUSCULAR | Status: DC

## 2023-06-07 MED ORDER — SODIUM CHLORIDE 0.9 % IV SOLN
200.0000 mg | Freq: Once | INTRAVENOUS | Status: AC
  Administered 2023-06-07: 200 mg via INTRAVENOUS
  Filled 2023-06-07: qty 40

## 2023-06-07 MED ORDER — ONDANSETRON HCL 4 MG PO TABS
4.0000 mg | ORAL_TABLET | Freq: Four times a day (QID) | ORAL | Status: DC | PRN
  Administered 2023-06-09: 4 mg via ORAL
  Filled 2023-06-07: qty 1

## 2023-06-07 MED ORDER — ACETAMINOPHEN 650 MG RE SUPP: 650.0000 mg | Freq: Four times a day (QID) | RECTAL | Status: DC | PRN

## 2023-06-07 MED ORDER — SODIUM CHLORIDE 0.9 % IV SOLN: 100.0000 mg | Freq: Every day | INTRAVENOUS | Status: DC

## 2023-06-07 MED ORDER — ENOXAPARIN SODIUM 30 MG/0.3ML IJ SOSY
30.0000 mg | PREFILLED_SYRINGE | INTRAMUSCULAR | Status: DC
  Administered 2023-06-07 – 2023-06-09 (×3): 30 mg via SUBCUTANEOUS
  Filled 2023-06-07 (×3): qty 0.3

## 2023-06-07 MED ORDER — LEVOTHYROXINE SODIUM 25 MCG PO TABS
25.0000 ug | ORAL_TABLET | Freq: Every day | ORAL | Status: DC
  Administered 2023-06-07 – 2023-06-09 (×3): 25 ug via ORAL
  Filled 2023-06-07 (×3): qty 1

## 2023-06-07 MED ORDER — ONDANSETRON HCL 4 MG/2ML IJ SOLN
4.0000 mg | Freq: Four times a day (QID) | INTRAMUSCULAR | Status: DC | PRN
  Administered 2023-06-08: 4 mg via INTRAVENOUS
  Filled 2023-06-07: qty 2

## 2023-06-07 MED ORDER — PRAVASTATIN SODIUM 10 MG PO TABS
10.0000 mg | ORAL_TABLET | Freq: Every evening | ORAL | Status: DC
  Administered 2023-06-07 – 2023-06-09 (×3): 10 mg via ORAL
  Filled 2023-06-07 (×3): qty 1

## 2023-06-07 MED ORDER — INSULIN ASPART 100 UNIT/ML IJ SOLN
0.0000 [IU] | Freq: Three times a day (TID) | INTRAMUSCULAR | Status: DC
  Administered 2023-06-07 – 2023-06-09 (×5): 1 [IU] via SUBCUTANEOUS

## 2023-06-07 NOTE — Assessment & Plan Note (Signed)
Appears she was on BID Xanax until around last fall, then tapered down and the off of it completely for about 6 months until 1 month refill two months ago.   - Avoid benzodiazepines given #1 above

## 2023-06-07 NOTE — Assessment & Plan Note (Signed)
Continue pravastatin 

## 2023-06-07 NOTE — H&P (Addendum)
History and Physical    Andrea Gordon VHQ:[id number] DOB: [birth year] DOA: 06/06/2023  PCP: Leane Call, PA-C  Patient coming from: home  I have personally briefly reviewed patient's old medical records in [facility]  Chief Complaint: change in mental status/n/v  HPI: Andrea Gordon is a 88 y.o. female with medical history significant of   hypertension, anxiety, hyperlipidemia, and type 2 diabetes diverticulosis, depression who presents to ED BIB grandson with complaint of change in ms, low appetite and one episode of n/v which all began with in the last 24hours. Per family patient at baseline is alert and oriented x 4 and is able to perform her ADLS. Of note history obtained from grandson as patient is confused.  ED Course:  Temp 102, hgb 136/93, HR 77, rr 18 , sat 97% on ra  EKG:  snr , LAD  Resp panel, + for covid Labs wbc 6.3, hg 12.3, plt 181  Inr 1.1  Na 135, K 4, CL 101, Glu 208, cr 1.3 (0.84) Lipase 35 Lactic 2.3 UA: larg LE, wbc>50 rare bacteria CT abd MPRESSION: 1. No specific acute findings are identified to explain the patient's abdominal pain. 2. Substantial atheromatous plaque in the proximal superior mesenteric artery, with ill definition of the celiac trunk, possible common origin of the celiac trunk and SMA. Patency of this vessel is not assessed on today's noncontrast exam but the plaque is expected to at least cause substantial stenosis. If the patient were to have postprandial abdominal pain or unexplained weight loss then the possibility of chronic mesenteric ischemia might be considered and CT angiographic workup within be suggested. 3. Aortic atherosclerosis. 4. Mild cardiomegaly. 5. Bandlike opacity at the left lung base favoring atelectasis. 6. Large anterior abdominal wall hernia mesh. 7. Potential low position of the anorectal junction suggesting pelvic floor laxity. 8. Extensive lower thoracic and lumbar spondylosis and degenerative disc  disease with multilevel impingement. 9. Lobularity of the left hepatic lobe anteriorly is likely mostly due to the hernia mesh adjacent to the liver margin. There is some prominence of the lateral segment left hepatic lobe which could be a early indicator of cirrhosis. Cxr:  IMPRESSION: Stable slight elevation of the left hemidiaphragm with some adjacent scar or atelectasis. No acute cardiopulmonary disease Tx cefepime,tylenol, flagyl  iv500mg  ,vanc Review of Systems: As per HPI otherwise 10 point review of systems negative.   Past Medical History:  Diagnosis Date   Anxiety    Depression    Diabetes mellitus (HCC)    Diverticulosis    History of shingles    HTN (hypertension)    Hyperlipidemia    Pernicious anemia    Thyroid nodule     Past Surgical History:  Procedure Laterality Date   abalation of nerve     in back   ANTERIOR CERVICAL DECOMP/DISCECTOMY FUSION N/A 01/06/2018   Procedure: ANTERIOR CERVICAL DISCECTOMY CERVICAL THREE- CERVICAL FOUR, CERVICAL SIX- CERVICAL SEVEN FUSION, INTERBODY PROSTHESIS AND ANTERIOR PLATING;  Surgeon: Tressie Stalker, MD;  Location: [facility] OR;  Service: Neurosurgery;  Laterality: N/A;  anterior   ANTERIOR CERVICAL DISCECTOMY  01/06/2018   blepharplasty Bilateral    CARPAL TUNNEL RELEASE Bilateral    CATARACT EXTRACTION     bilateral   cervical discestomy  1998   CHOLECYSTECTOMY     COLECTOMY     EYE SURGERY Bilateral    cataract removal   HERNIA REPAIR     x 2   JOINT REPLACEMENT Left  total knee   TONSILLECTOMY       reports that she has never smoked. She has never used smokeless tobacco. She reports that she does not drink alcohol and does not use drugs.  Allergies  Allergen Reactions   Shellfish Allergy Anaphylaxis and Swelling    Tongue swelled   Codeine     Stomach upset   Contrast Media [Iodinated Contrast Media] Other (See Comments)    Pt cannot remember what occurred.   Omeprazole     Makes nervous    Family  History  Problem Relation Age of Onset   Cancer Father        prostate   Coronary artery disease Neg Hx     Prior to Admission medications   Medication Sig Start Date End Date Taking? Authorizing Provider  aspirin EC 81 MG tablet Take 81 mg by mouth daily.   Yes [provider]  atenolol-chlorthalidone (TENORETIC) 50-25 MG per tablet Take 1 tablet by mouth daily.   Yes [provider]  levothyroxine (SYNTHROID) 25 MCG tablet Take 25 mcg by mouth daily before breakfast.   Yes [provider]  metFORMIN (GLUCOPHAGE-XR) 500 MG 24 hr tablet Take 500 mg by mouth daily with breakfast. 04/29/22  Yes [provider]  pantoprazole (PROTONIX) 40 MG tablet Take 40 mg by mouth daily. 04/29/22  Yes [provider]  potassium chloride (K-DUR) 10 MEQ tablet Take 10 mEq by mouth daily.   Yes [provider]  pravastatin (PRAVACHOL) 10 MG tablet Take 10 mg by mouth every evening.    Yes [provider]    Physical Exam: Vitals:   06/06/23 2008 06/06/23 2100 06/07/23 0025 06/07/23 0026  BP:   (!) 129/54   Pulse: 70 62 (!) 59   Resp: (!) 21 19 19    Temp: 99 F (37.2 C)   98.9 F (37.2 C)  TempSrc: Oral   Oral  SpO2: 95% 95% 96%   Weight:        Constitutional: NAD, calm, comfortable Vitals:   06/06/23 2008 06/06/23 2100 06/07/23 0025 06/07/23 0026  BP:   (!) 129/54   Pulse: 70 62 (!) 59   Resp: (!) 21 19 19    Temp: 99 F (37.2 C)   98.9 F (37.2 C)  TempSrc: Oral   Oral  SpO2: 95% 95% 96%   Weight:       Eyes: PERRL, lids and conjunctivae normal ENMT: Mucous membranes are moist. Posterior pharynx clear of any exudate or lesions.Normal dentition.  Neck: normal, supple, no masses, no thyromegaly Respiratory: clear to auscultation bilaterally, no wheezing, no crackles. Normal respiratory effort. No accessory muscle use.  Cardiovascular: Regular rate and rhythm, no murmurs / rubs / gallops. No extremity edema. 2+ pedal pulses.   Abdomen: no tenderness, no masses palpated. No hepatosplenomegaly. Bowel sounds positive.  Musculoskeletal: no clubbing / cyanosis. No joint deformity upper and lower extremities. Good ROM, no contractures. Normal muscle tone.  Skin: no rashes, lesions, ulcers. No induration Neurologic: CN 2-12 grossly intact. Sensation intact, . MAE x 4 Psychiatric: unable to fully assess, is confused  Labs on Admission: I have personally reviewed following labs and imaging studies  CBC: Recent Labs  Lab 06/06/23 1719 06/06/23 1739  WBC 6.3  --   NEUTROABS 4.7  --   HGB 12.3 12.2  HCT 37.8 36.0  MCV 91.5  --   PLT 181  --    Basic Metabolic Panel: Recent Labs  Lab 06/06/23 1719  06/06/23 1739  NA 135 136  K 4.0 4.5  CL 101 102  CO2 22  --   GLUCOSE 208* 208*  BUN 31* 38*  CREATININE 1.30* 1.30*  CALCIUM 9.6  --    GFR: CrCl cannot be calculated (Unknown ideal weight.). Liver Function Tests: Recent Labs  Lab 06/06/23 1719  AST 23  ALT 15  ALKPHOS 58  BILITOT 1.3*  PROT 6.9  ALBUMIN 3.5   Recent Labs  Lab 06/06/23 1719  LIPASE 35   No results for input(s): "AMMONIA" in the last 168 hours. Coagulation Profile: Recent Labs  Lab 06/06/23 1719  INR 1.1   Cardiac Enzymes: No results for input(s): "CKTOTAL", "CKMB", "CKMBINDEX", "TROPONINI" in the last 168 hours. BNP (last 3 results) No results for input(s): "PROBNP" in the last 8760 hours. HbA1C: No results for input(s): "HGBA1C" in the last 72 hours. CBG: No results for input(s): "GLUCAP" in the last 168 hours. Lipid Profile: No results for input(s): "CHOL", "HDL", "LDLCALC", "TRIG", "CHOLHDL", "LDLDIRECT" in the last 72 hours. Thyroid Function Tests: No results for input(s): "TSH", "T4TOTAL", "FREET4", "T3FREE", "THYROIDAB" in the last 72 hours. Anemia Panel: No results for input(s): "VITAMINB12", "FOLATE", "FERRITIN", "TIBC", "IRON", "RETICCTPCT" in the last 72 hours. Urine analysis:    Component Value  Date/Time   COLORURINE YELLOW 06/06/2023 1748   APPEARANCEUR CLOUDY (A) 06/06/2023 1748   LABSPEC 1.015 06/06/2023 1748   PHURINE 6.0 06/06/2023 1748   GLUCOSEU NEGATIVE 06/06/2023 1748   HGBUR SMALL (A) 06/06/2023 1748   BILIRUBINUR NEGATIVE 06/06/2023 1748   KETONESUR NEGATIVE 06/06/2023 1748   PROTEINUR 30 (A) 06/06/2023 1748   NITRITE NEGATIVE 06/06/2023 1748   LEUKOCYTESUR LARGE (A) 06/06/2023 1748    Radiological Exams on Admission: CT ABDOMEN PELVIS WO CONTRAST  Result Date: 06/06/2023 CLINICAL DATA:  Acute abdominal pain.  Fever. EXAM: CT ABDOMEN AND PELVIS WITHOUT CONTRAST TECHNIQUE: Multidetector CT imaging of the abdomen and pelvis was performed following the standard protocol without IV contrast. RADIATION DOSE REDUCTION: This exam was performed according to the departmental dose-optimization program which includes automated exposure control, adjustment of the mA and/or kV according to patient size and/or use of iterative reconstruction technique. COMPARISON:  02/01/2021 FINDINGS: Lower chest: Left anterior descending and right coronary artery atherosclerosis with descending thoracic aortic atheromatous vascular disease. Mild cardiomegaly. Bandlike opacity at the left lung base favoring atelectasis. Borderline elevated left hemidiaphragm. Hepatobiliary: Lobularity of the left hepatic lobe anteriorly is likely mostly due to the hernia mesh adjacent to the liver margin. There is some prominence of the lateral segment left hepatic lobe which could be a early indicator of cirrhosis. Cholecystectomy noted. No biliary dilatation. Pancreas: Unremarkable Spleen: Unremarkable Adrenals/Urinary Tract: Unremarkable Stomach/Bowel: Scattered colonic diverticula. No findings of acute diverticulitis. Normal appendix. Suspected partial colectomy. Borderline prominence of gas and stool in rectal vault without wall thickening to suggest stercoral colitis. No dilated small bowel. Vascular/Lymphatic:  Atherosclerosis is present, including aortoiliac atherosclerotic disease. There is substantial atheromatous plaque proximally in the superior mesenteric artery. Ill definition of the celiac trunk, possible common origin of the celiac trunk and SMA. Patency of this vessel is not assessed on today's noncontrast exam but the plaque is expected to at least cause substantial stenosis. If the patient were to have postprandial abdominal pain or unexplained weight loss then the possibility of chronic mesenteric ischemia might be considered and CT angiographic workup within be suggested. Reproductive: Unremarkable Other: No supplemental non-categorized findings. Musculoskeletal: Large anterior abdominal wall hernia mesh. No recurrent  hernia. Potential low position of the anorectal junction suggesting pelvic floor laxity. Extensive lower thoracic and lumbar spondylosis and degenerative disc disease with multilevel impingement. Bony demineralization. IMPRESSION: 1. No specific acute findings are identified to explain the patient's abdominal pain. 2. Substantial atheromatous plaque in the proximal superior mesenteric artery, with ill definition of the celiac trunk, possible common origin of the celiac trunk and SMA. Patency of this vessel is not assessed on today's noncontrast exam but the plaque is expected to at least cause substantial stenosis. If the patient were to have postprandial abdominal pain or unexplained weight loss then the possibility of chronic mesenteric ischemia might be considered and CT angiographic workup within be suggested. 3. Aortic atherosclerosis. 4. Mild cardiomegaly. 5. Bandlike opacity at the left lung base favoring atelectasis. 6. Large anterior abdominal wall hernia mesh. 7. Potential low position of the anorectal junction suggesting pelvic floor laxity. 8. Extensive lower thoracic and lumbar spondylosis and degenerative disc disease with multilevel impingement. 9. Lobularity of the left hepatic  lobe anteriorly is likely mostly due to the hernia mesh adjacent to the liver margin. There is some prominence of the lateral segment left hepatic lobe which could be a early indicator of cirrhosis. Aortic Atherosclerosis (ICD10-I70.0). Electronically Signed   By: Gaylyn Rong M.D.   On: 06/06/2023 21:01   DG Chest Port 1 View  Result Date: 06/06/2023 CLINICAL DATA:  Sepsis.  Altered mental status. EXAM: PORTABLE CHEST 1 VIEW COMPARISON:  X-ray 03/28/2022 FINDINGS: Slight elevation of the left hemidiaphragm is stable. No consolidation, pneumothorax or effusion. Slight left basilar scar or atelectasis. Normal cardiopericardial silhouette. Calcified aorta. Film is rotated to the left. Degenerative changes are seen with osteopenia. Fixation hardware along the lower cervical spine at the edge of the imaging field. Right shoulder reverse arthroplasty. Overlapping cardiac leads. IMPRESSION: Stable slight elevation of the left hemidiaphragm with some adjacent scar or atelectasis. No acute cardiopulmonary disease Electronically Signed   By: Karen Kays M.D.   On: 06/06/2023 19:06    EKG: Independently reviewed. See above Assessment/Plan   UTI with associated sepsis  -admit to progressive care  - continue with ivfs per sepsis protocol -s/p vanc/metronidazole/cefepime in ED - will start CTX and f/u on cultures ,no prior cultures    COVID -19  viral infection  - mild cough x 48 hours  -cxr clear - remdesivir per protocol  - monitor on continuous pulse ox  -judicious ivfs if absolutely needed  -encourage po fluid intake  -daily monitoring of COVID labs  -dvt ppx per protocol    Acute Metabolic Encephalopathy  -CTH neg - ammonia wnl - presumed related to above infections  -neuro checks  -continue to monitor for improvement back to baseline    Hypertension -relative low compared to baseline  -will hold bp meds for now resume in am as bp tolerates   Anxiety/depression -resume home  regimen as able /once med rec compelted  Hypothyroidism -resume synthroid   Hyperlipidemia -resume statin as able   DMII Iss/fs    DVT prophylaxis: lovenox Code Status: full/ as discussed per patient wishes in event of cardiac arrest  Family Communication:   Nilan,Mathew [employer]) [phone] (Home Phone)   Disposition Plan: patient  expected to be admitted greater than 2 midnights  Consults called: n/a Admission status: progressive care   Lurline Del MD [facility]   If 7PM-7AM, please contact night-coverage [email or web address] Password [email or web address]  06/07/2023, 1:13 AM

## 2023-06-07 NOTE — Assessment & Plan Note (Signed)
BP normal - Hold atenolol-chlorthalidone for now until hemodynamics clearer

## 2023-06-07 NOTE — Assessment & Plan Note (Addendum)
TSH normal 2 months ago - Continue levothyroxine

## 2023-06-07 NOTE — Hospital Course (Addendum)
Mrs. Limbert is an 88 y.o. F with DM, HTN, hypothyroidism and CKD IIIb baseline 1.3 who presented with confusion, found to have COVID and UTI.

## 2023-06-07 NOTE — Evaluation (Signed)
Physical Therapy Evaluation Patient Details Name: Andrea Gordon MRN: [id number] DOB: 18-Oct-1934 Today's Date: 06/07/2023  History of Present Illness  Andrea Gordon is a 88 y.o. female with medical history significant of  hypertension, anxiety, hyperlipidemia, and type 2 diabetes diverticulosis, depression who presents to [facility] ED on 06/06/23. BIB grandson with complaint of change in ms, low appetite and one episode of n/v which all began with in the last 24hours.  Patient found to be covid +  Clinical Impression  Patient received in bed, she is HOH. She is somewhat labile about condition and returning home to family. Patient needs min A for bed mobility. She is able to stand with min A and ambulated 40 feet in room with rw and min guard. Patient ambulates well with no lob. She will continue to benefit from skilled PT to improve strength and independence.          If plan is discharge home, recommend the following: A little help with walking and/or transfers;A little help with bathing/dressing/bathroom;Assist for transportation;Help with stairs or ramp for entrance;Assistance with cooking/housework   Can travel by private vehicle    yes    Equipment Recommendations None recommended by PT  Recommendations for Other Services       Functional Status Assessment Patient has had a recent decline in their functional status and demonstrates the ability to make significant improvements in function in a reasonable and predictable amount of time.     Precautions / Restrictions Precautions Precautions: Fall Restrictions Weight Bearing Restrictions: No      Mobility  Bed Mobility Overal bed mobility: Modified Independent                  Transfers Overall transfer level: Needs assistance Equipment used: Rolling walker (2 wheels) Transfers: Sit to/from Stand Sit to Stand: Contact guard assist           General transfer comment: cues for hand placement     Ambulation/Gait Ambulation/Gait assistance: Contact guard assist Gait Distance (Feet): 40 Feet Assistive device: Rolling walker (2 wheels) Gait Pattern/deviations: Step-through pattern Gait velocity: WNL     General Gait Details: patient ambulates well in room with RW.  Stairs            Wheelchair Mobility     Tilt Bed    Modified Rankin (Stroke Patients Only)       Balance Overall balance assessment: Modified Independent                                           Pertinent Vitals/Pain Pain Assessment Pain Assessment: Faces Faces Pain Scale: Hurts a little bit Pain Location: B heels Pain Descriptors / Indicators: Discomfort, Sore Pain Intervention(s): Monitored during session, Repositioned    Home Living Family/patient expects to be discharged to:: Private residence Living Arrangements: Children Available Help at Discharge: Family;Available PRN/intermittently Type of Home: House Home Access: Level entry       Home Layout: One level Home Equipment: Agricultural consultant (2 wheels)      Prior Function Prior Level of Function : Independent/Modified Independent             Mobility Comments: patient uses RW at baseline. She is alone part of the day while grandson is at work ADLs Comments: independent     Extremity/Trunk Assessment   Upper Extremity Assessment Upper Extremity Assessment: Overall [facility]  for tasks assessed    Lower Extremity Assessment Lower Extremity Assessment: Generalized weakness    Cervical / Trunk Assessment Cervical / Trunk Assessment: Normal  Communication   Communication Communication: Hearing impairment Cueing Techniques: Verbal cues  Cognition Arousal: Alert Behavior During Therapy: WFL for tasks assessed/performed Overall Cognitive Status: Within Functional Limits for tasks assessed                                          General Comments      Exercises     Assessment/Plan     PT Assessment Patient needs continued PT services  PT Problem List Decreased strength;Decreased activity tolerance;Decreased balance;Decreased mobility;Decreased cognition       PT Treatment Interventions Gait training;Functional mobility training;Therapeutic activities;Therapeutic exercise;Patient/family education    PT Goals (Current goals can be found in the Care Plan section)  Acute Rehab PT Goals Patient Stated Goal: to return home PT Goal Formulation: With patient Time For Goal Achievement: 06/12/23 Potential to Achieve Goals: Good    Frequency Min 1X/week     Co-evaluation               AM-PAC PT "6 Clicks" Mobility  Outcome Measure Help needed turning from your back to your side while in a flat bed without using bedrails?: None Help needed moving from lying on your back to sitting on the side of a flat bed without using bedrails?: A Little Help needed moving to and from a bed to a chair (including a wheelchair)?: A Little Help needed standing up from a chair using your arms (e.g., wheelchair or bedside chair)?: A Little Help needed to walk in hospital room?: A Little Help needed climbing 3-5 steps with a railing? : A Little 6 Click Score: 19    End of Session   Activity Tolerance: Patient tolerated treatment well Patient left: in bed;with call bell/phone within reach;with bed alarm set Nurse Communication: Mobility status PT Visit Diagnosis: Muscle weakness (generalized) (M62.81)    Time: 8413-2440 PT Time Calculation (min) (ACUTE ONLY): 26 min   Charges:   PT Evaluation $PT Eval Moderate Complexity: 1 Mod PT Treatments $Gait Training: 8-22 mins PT General Charges $$ ACUTE PT VISIT: 1 Visit         Andrea Gordon, PT, GCS 06/07/23,1:24 PM

## 2023-06-07 NOTE — Progress Notes (Signed)
  Progress Note   Patient: Andrea Gordon WUJ:[id number] DOB: Feb 04, 1935 DOA: 06/06/2023     0 DOS: the patient was seen and examined on 06/07/2023 at 7:55AM      Brief hospital course: Andrea Gordon is an 88 y.o. F with DM, HTN, hypothyroidism and CKD IIIb baseline 1.3 who presented with confusion, found to have COVID and UTI.     Assessment and Plan: * Acute metabolic encephalopathy due to UTI and COVID Presented with fever, found to have UTI and COVID.  HR, RR and WBC normal, sepsis ruled out.   - Continue Rocephin - Follow urine culture and blood cultures - Given remdesivir x1, given stability, will defer further antivirals    Hypothyroidism TSH normal 2 months ago - Continue levothyroxine  Anxiety Appears she was on BID Xanax until around last fall, then tapered down and the off of it completely for about 6 months until 1 month refill two months ago.   - Avoid benzodiazepines given #1 above  Essential hypertension BP normal - Hold atenolol-chlorthalidone for now until hemodynamics clearer  Hyperlipidemia - Continue pravastatin  DMII (diabetes mellitus, type 2) (HCC) A1c 7.5%, glucose here controlled - Start SS corrections  - Continue aspirin, pravastatin - Hold metformin          Subjective: Still confused.  Still with urinary urgency.  No respiratory symptoms.     Physical Exam: BP (!) 137/54 (BP Location: Right Arm)   Pulse 67   Temp 99.1 F (37.3 C) (Oral)   Resp 18   Wt 61.2 kg   SpO2 97%   BMI 24.69 kg/m   Patient seen and examined.  Data Reviewed: BMP shows renal function at baseline.  No fever, no vomiting.      Disposition: Status is: Inpatient         Author: Alberteen Sam, MD 06/07/2023 12:21 PM  For on call review [email or web address].

## 2023-06-07 NOTE — Care Plan (Signed)
Called to grandson, no answer.  No voicemail.

## 2023-06-07 NOTE — ED Notes (Signed)
ED TO INPATIENT HANDOFF REPORT  ED Nurse Name and Phone #:   S Name/Age/Gender Drucie Ip 88 y.o. female Room/Bed: 004C/004C  Code Status   Code Status: Full Code  Home/SNF/Other Home Patient oriented to: self Is this baseline? No   Triage Complete: Triage complete  Chief Complaint Sepsis secondary to UTI (HCC) [A41.9, N39.0]  Triage Note Pt presents to ED with altered mental status. Pts grandson states that normally she is A/Ox4, and able to ambulate to the bathroom on her own, but this morning she was found to be altered, didn't eat much like she normally does, and at church she wasn't her normal self, she is responding to stimuli and states her lower abdomen is hurting. Pts grandson did mention that she had one episode of emesis today and noticed a redness in her inguinal area which was not there two days ago.    Allergies Allergies  Allergen Reactions   Shellfish Allergy Anaphylaxis and Swelling    Tongue swelled   Codeine     Stomach upset   Contrast Media [Iodinated Contrast Media] Other (See Comments)    Pt cannot remember what occurred.   Omeprazole     Makes nervous    Level of Care/Admitting Diagnosis ED Disposition     ED Disposition  Admit   Condition  --   Comment  Hospital Area: [facility] [[id number]]  Level of Care: Progressive [102]  Admit to Progressive based on following criteria: MULTISYSTEM THREATS such as stable sepsis, metabolic/electrolyte imbalance with or without encephalopathy that is responding to early treatment.  May admit patient to [facility] or [facility] if equivalent level of care is available:: No  Covid Evaluation: Asymptomatic - no recent exposure (last 10 days) testing not required  Diagnosis: Sepsis secondary to UTI [facility]) [[id number]]  Admitting Physician: Lurline Del [[id number]]  Attending Physician: Lurline Del [[id number]]  Certification:: I certify this patient will need inpatient services for  at least 2 midnights  Expected Medical Readiness: 06/10/2023          B Medical/Surgery History Past Medical History:  Diagnosis Date   Anxiety    Depression    Diabetes mellitus (HCC)    Diverticulosis    History of shingles    HTN (hypertension)    Hyperlipidemia    Pernicious anemia    Thyroid nodule    Past Surgical History:  Procedure Laterality Date   abalation of nerve     in back   ANTERIOR CERVICAL DECOMP/DISCECTOMY FUSION N/A 01/06/2018   Procedure: ANTERIOR CERVICAL DISCECTOMY CERVICAL THREE- CERVICAL FOUR, CERVICAL SIX- CERVICAL SEVEN FUSION, INTERBODY PROSTHESIS AND ANTERIOR PLATING;  Surgeon: Tressie Stalker, MD;  Location: [facility] OR;  Service: Neurosurgery;  Laterality: N/A;  anterior   ANTERIOR CERVICAL DISCECTOMY  01/06/2018   blepharplasty Bilateral    CARPAL TUNNEL RELEASE Bilateral    CATARACT EXTRACTION     bilateral   cervical discestomy  1998   CHOLECYSTECTOMY     COLECTOMY     EYE SURGERY Bilateral    cataract removal   HERNIA REPAIR     x 2   JOINT REPLACEMENT Left    total knee   TONSILLECTOMY       A IV Location/Drains/Wounds Patient Lines/Drains/Airways Status     Active Line/Drains/Airways     Name Placement date Placement time Site Days   Peripheral IV 06/06/23 20 G Right Antecubital 06/06/23  1715  Antecubital  1   Peripheral IV  06/06/23 22 G Left;Posterior Hand 06/06/23  1715  Hand  1   Incision (Closed) 01/06/18 Neck 01/06/18  1650  -- 1978            Intake/Output Last 24 hours  Intake/Output Summary (Last 24 hours) at 06/07/2023 0142 Last data filed at 06/06/2023 1938 Gross per 24 hour  Intake 247.58 ml  Output --  Net 247.58 ml    Labs/Imaging Results for orders placed or performed during the hospital encounter of 06/06/23 (from the past 48 hour(s))  Resp panel by RT-PCR (RSV, Flu A&B, Covid) Anterior Nasal Swab     Status: Abnormal   Collection Time: 06/06/23  4:55 PM   Specimen: Anterior Nasal Swab  Result  Value Ref Range   SARS Coronavirus 2 by RT PCR POSITIVE (A) NEGATIVE   Influenza A by PCR NEGATIVE NEGATIVE   Influenza B by PCR NEGATIVE NEGATIVE    Comment: (NOTE) The Xpert Xpress SARS-CoV-2/FLU/RSV plus assay is intended as an aid in the diagnosis of influenza from Nasopharyngeal swab specimens and should not be used as a sole basis for treatment. Nasal washings and aspirates are unacceptable for Xpert Xpress SARS-CoV-2/FLU/RSV testing.  Fact Sheet for Patients: BloggerCourse.com  Fact Sheet for Healthcare Providers: SeriousBroker.it  This test is not yet approved or cleared by the [place] FDA and has been authorized for detection and/or diagnosis of SARS-CoV-2 by FDA under an Emergency Use Authorization (EUA). This EUA will remain in effect (meaning this test can be used) for the duration of the COVID-19 declaration under Section 564(b)(1) of the Act, 21 U.S.C. section 360bbb-3(b)(1), unless the authorization is terminated or revoked.     Resp Syncytial Virus by PCR NEGATIVE NEGATIVE    Comment: (NOTE) Fact Sheet for Patients: BloggerCourse.com  Fact Sheet for Healthcare Providers: SeriousBroker.it  This test is not yet approved or cleared by the [place] FDA and has been authorized for detection and/or diagnosis of SARS-CoV-2 by FDA under an Emergency Use Authorization (EUA). This EUA will remain in effect (meaning this test can be used) for the duration of the COVID-19 declaration under Section 564(b)(1) of the Act, 21 U.S.C. section 360bbb-3(b)(1), unless the authorization is terminated or revoked.  Performed at [facility], [address] [place], [place] [address]   Blood Culture (routine x 2)     Status: None (Preliminary result)   Collection Time: 06/06/23  5:00 PM   Specimen: BLOOD  Result Value Ref Range   Specimen Description BLOOD SITE  NOT SPECIFIED    Special Requests      BOTTLES DRAWN AEROBIC AND ANAEROBIC Blood Culture adequate volume Performed at [facility], [address] [place], [place] [address]    Culture PENDING    Report Status PENDING   Comprehensive metabolic panel     Status: Abnormal   Collection Time: 06/06/23  5:19 PM  Result Value Ref Range   Sodium 135 135 - 145 mmol/L   Potassium 4.0 3.5 - 5.1 mmol/L    Comment: HEMOLYSIS AT THIS LEVEL MAY AFFECT RESULT   Chloride 101 98 - 111 mmol/L   CO2 22 22 - 32 mmol/L   Glucose, Bld 208 (H) 70 - 99 mg/dL    Comment: Glucose reference range applies only to samples taken after fasting for at least 8 hours.   BUN 31 (H) 8 - 23 mg/dL   Creatinine, Ser 8.29 (H) 0.44 - 1.00 mg/dL   Calcium 9.6 8.9 - 56.2 mg/dL  Total Protein 6.9 6.5 - 8.1 g/dL   Albumin 3.5 3.5 - 5.0 g/dL   AST 23 15 - 41 U/L    Comment: HEMOLYSIS AT THIS LEVEL MAY AFFECT RESULT   ALT 15 0 - 44 U/L    Comment: HEMOLYSIS AT THIS LEVEL MAY AFFECT RESULT   Alkaline Phosphatase 58 38 - 126 U/L   Total Bilirubin 1.3 (H) 0.3 - 1.2 mg/dL    Comment: HEMOLYSIS AT THIS LEVEL MAY AFFECT RESULT   GFR, Estimated 40 (L) >60 mL/min    Comment: (NOTE) Calculated using the CKD-EPI Creatinine Equation (2021)    Anion gap 12 5 - 15    Comment: Performed at [facility], [address], [place], [place] [address]  CBC with Differential     Status: None   Collection Time: 06/06/23  5:19 PM  Result Value Ref Range   WBC 6.3 4.0 - 10.5 K/uL   RBC 4.13 3.87 - 5.11 MIL/uL   Hemoglobin 12.3 12.0 - 15.0 g/dL   HCT 32.4 40.1 - 02.7 %   MCV 91.5 80.0 - 100.0 fL   MCH 29.8 26.0 - 34.0 pg   MCHC 32.5 30.0 - 36.0 g/dL   RDW 25.3 66.4 - 40.3 %   Platelets 181 150 - 400 K/uL   nRBC 0.0 0.0 - 0.2 %   Neutrophils Relative % 74 %   Neutro Abs 4.7 1.7 - 7.7 K/uL   Lymphocytes Relative 13 %   Lymphs Abs 0.8 0.7 - 4.0 K/uL   Monocytes Relative 11 %   Monocytes Absolute 0.7 0.1 - 1.0 K/uL    Eosinophils Relative 1 %   Eosinophils Absolute 0.1 0.0 - 0.5 K/uL   Basophils Relative 1 %   Basophils Absolute 0.1 0.0 - 0.1 K/uL   Immature Granulocytes 0 %   Abs Immature Granulocytes 0.02 0.00 - 0.07 K/uL    Comment: Performed at [facility], [address], [place], [place] [address]  Protime-INR     Status: None   Collection Time: 06/06/23  5:19 PM  Result Value Ref Range   Prothrombin Time 14.0 11.4 - 15.2 seconds   INR 1.1 0.8 - 1.2    Comment: (NOTE) INR goal varies based on device and disease states. Performed at [facility], [address], [place], [place] [address]   APTT     Status: Abnormal   Collection Time: 06/06/23  5:19 PM  Result Value Ref Range   aPTT 23 (L) 24 - 36 seconds    Comment: Performed at [facility], [address] [place], [place] [address]  Lipase, blood     Status: None   Collection Time: 06/06/23  5:19 PM  Result Value Ref Range   Lipase 35 11 - 51 U/L    Comment: Performed at [facility], [address] [place], [place] [address]  I-Stat Lactic Acid, ED     Status: Abnormal   Collection Time: 06/06/23  5:39 PM  Result Value Ref Range   Lactic Acid, Venous 2.3 (HH) 0.5 - 1.9 mmol/L   Comment NOTIFIED PHYSICIAN   I-Stat Chem 8, ED     Status: Abnormal   Collection Time: 06/06/23  5:39 PM  Result Value Ref Range   Sodium 136 135 - 145 mmol/L   Potassium 4.5 3.5 - 5.1 mmol/L   Chloride 102 98 - 111 mmol/L   BUN 38 (H) 8 - 23 mg/dL  Creatinine, Ser 1.30 (H) 0.44 - 1.00 mg/dL   Glucose, Bld 253 (H) 70 - 99 mg/dL    Comment: Glucose reference range applies only to samples taken after fasting for at least 8 hours.   Calcium, Ion 1.18 1.15 - 1.40 mmol/L   TCO2 24 22 - 32 mmol/L   Hemoglobin 12.2 12.0 - 15.0 g/dL   HCT 66.4 40.3 - 47.4 %  Urinalysis, w/ Reflex to Culture (Infection Suspected) -Urine, Clean Catch     Status: Abnormal   Collection Time: 06/06/23  5:48 PM  Result Value Ref Range   Specimen Source  URINE, CLEAN CATCH    Color, Urine YELLOW YELLOW   APPearance CLOUDY (A) CLEAR   Specific Gravity, Urine 1.015 1.005 - 1.030   pH 6.0 5.0 - 8.0   Glucose, UA NEGATIVE NEGATIVE mg/dL   Hgb urine dipstick SMALL (A) NEGATIVE   Bilirubin Urine NEGATIVE NEGATIVE   Ketones, ur NEGATIVE NEGATIVE mg/dL   Protein, ur 30 (A) NEGATIVE mg/dL   Nitrite NEGATIVE NEGATIVE   Leukocytes,Ua LARGE (A) NEGATIVE   RBC / HPF 6-10 0 - 5 RBC/hpf   WBC, UA >50 0 - 5 WBC/hpf    Comment:        Reflex urine culture not performed if WBC <=10, OR if Squamous epithelial cells >5. If Squamous epithelial cells >5 suggest recollection.    Bacteria, UA RARE (A) NONE SEEN   Squamous Epithelial / HPF 0-5 0 - 5 /HPF   Mucus PRESENT    Non Squamous Epithelial 0-5 (A) NONE SEEN    Comment: Performed at [facility], [address], [place], [place] [address]  I-Stat Lactic Acid, ED     Status: None   Collection Time: 06/06/23  8:17 PM  Result Value Ref Range   Lactic Acid, Venous 0.7 0.5 - 1.9 mmol/L   CT ABDOMEN PELVIS WO CONTRAST  Result Date: 06/06/2023 CLINICAL DATA:  Acute abdominal pain.  Fever. EXAM: CT ABDOMEN AND PELVIS WITHOUT CONTRAST TECHNIQUE: Multidetector CT imaging of the abdomen and pelvis was performed following the standard protocol without IV contrast. RADIATION DOSE REDUCTION: This exam was performed according to the departmental dose-optimization program which includes automated exposure control, adjustment of the mA and/or kV according to patient size and/or use of iterative reconstruction technique. COMPARISON:  02/01/2021 FINDINGS: Lower chest: Left anterior descending and right coronary artery atherosclerosis with descending thoracic aortic atheromatous vascular disease. Mild cardiomegaly. Bandlike opacity at the left lung base favoring atelectasis. Borderline elevated left hemidiaphragm. Hepatobiliary: Lobularity of the left hepatic lobe anteriorly is likely mostly due to the hernia mesh  adjacent to the liver margin. There is some prominence of the lateral segment left hepatic lobe which could be a early indicator of cirrhosis. Cholecystectomy noted. No biliary dilatation. Pancreas: Unremarkable Spleen: Unremarkable Adrenals/Urinary Tract: Unremarkable Stomach/Bowel: Scattered colonic diverticula. No findings of acute diverticulitis. Normal appendix. Suspected partial colectomy. Borderline prominence of gas and stool in rectal vault without wall thickening to suggest stercoral colitis. No dilated small bowel. Vascular/Lymphatic: Atherosclerosis is present, including aortoiliac atherosclerotic disease. There is substantial atheromatous plaque proximally in the superior mesenteric artery. Ill definition of the celiac trunk, possible common origin of the celiac trunk and SMA. Patency of this vessel is not assessed on today's noncontrast exam but the plaque is expected to at least cause substantial stenosis. If the patient were to have postprandial abdominal pain or unexplained weight loss then the possibility of chronic mesenteric ischemia might be considered  and CT angiographic workup within be suggested. Reproductive: Unremarkable Other: No supplemental non-categorized findings. Musculoskeletal: Large anterior abdominal wall hernia mesh. No recurrent hernia. Potential low position of the anorectal junction suggesting pelvic floor laxity. Extensive lower thoracic and lumbar spondylosis and degenerative disc disease with multilevel impingement. Bony demineralization. IMPRESSION: 1. No specific acute findings are identified to explain the patient's abdominal pain. 2. Substantial atheromatous plaque in the proximal superior mesenteric artery, with ill definition of the celiac trunk, possible common origin of the celiac trunk and SMA. Patency of this vessel is not assessed on today's noncontrast exam but the plaque is expected to at least cause substantial stenosis. If the patient were to have  postprandial abdominal pain or unexplained weight loss then the possibility of chronic mesenteric ischemia might be considered and CT angiographic workup within be suggested. 3. Aortic atherosclerosis. 4. Mild cardiomegaly. 5. Bandlike opacity at the left lung base favoring atelectasis. 6. Large anterior abdominal wall hernia mesh. 7. Potential low position of the anorectal junction suggesting pelvic floor laxity. 8. Extensive lower thoracic and lumbar spondylosis and degenerative disc disease with multilevel impingement. 9. Lobularity of the left hepatic lobe anteriorly is likely mostly due to the hernia mesh adjacent to the liver margin. There is some prominence of the lateral segment left hepatic lobe which could be a early indicator of cirrhosis. Aortic Atherosclerosis (ICD10-I70.0). Electronically Signed   By: Gaylyn Rong M.D.   On: 06/06/2023 21:01   DG Chest Port 1 View  Result Date: 06/06/2023 CLINICAL DATA:  Sepsis.  Altered mental status. EXAM: PORTABLE CHEST 1 VIEW COMPARISON:  X-ray 03/28/2022 FINDINGS: Slight elevation of the left hemidiaphragm is stable. No consolidation, pneumothorax or effusion. Slight left basilar scar or atelectasis. Normal cardiopericardial silhouette. Calcified aorta. Film is rotated to the left. Degenerative changes are seen with osteopenia. Fixation hardware along the lower cervical spine at the edge of the imaging field. Right shoulder reverse arthroplasty. Overlapping cardiac leads. IMPRESSION: Stable slight elevation of the left hemidiaphragm with some adjacent scar or atelectasis. No acute cardiopulmonary disease Electronically Signed   By: Karen Kays M.D.   On: 06/06/2023 19:06    Pending Labs Unresulted Labs (From admission, onward)     Start     Ordered   06/14/23 0500  Creatinine, serum  (enoxaparin (LOVENOX)    CrCl >/= 30 ml/min)  Weekly,   R     Comments: while on enoxaparin therapy    06/07/23 0142   06/07/23 0500  Comprehensive metabolic  panel  Tomorrow morning,   R        06/07/23 0142   06/07/23 0500  CBC  Tomorrow morning,   R        06/07/23 0142   06/07/23 0142  Magnesium  Once,   R        06/07/23 0142   06/07/23 0142  Phosphorus  Once,   R        06/07/23 0142   06/07/23 0140  Hemoglobin A1c  (Glycemic Control (SSI)  Q 4 Hours / Glycemic Control (SSI)  AC +/- HS)  Once,   R       Comments: To assess prior glycemic control    06/07/23 0142   06/07/23 0140  CBC  (enoxaparin (LOVENOX)    CrCl >/= 30 ml/min)  Once,   R       Comments: Baseline for enoxaparin therapy IF NOT ALREADY DRAWN.  Notify MD if PLT < 100 K.  06/07/23 0142   06/07/23 0140  Creatinine, serum  (enoxaparin (LOVENOX)    CrCl >/= 30 ml/min)  Once,   R       Comments: Baseline for enoxaparin therapy IF NOT ALREADY DRAWN.    06/07/23 0142   06/06/23 1748  Urine Culture  Once,   R        06/06/23 1748   06/06/23 1655  Blood Culture (routine x 2)  (Septic presentation on arrival (screening labs, nursing and treatment orders for obvious sepsis))  BLOOD CULTURE X 2,   STAT      06/06/23 1657            Vitals/Pain Today's Vitals   06/06/23 2100 06/07/23 0025 06/07/23 0026 06/07/23 0100  BP:  (!) 129/54  (!) 106/45  Pulse: 62 (!) 59  61  Resp: 19 19  15   Temp:   98.9 F (37.2 C)   TempSrc:   Oral   SpO2: 95% 96%  95%  Weight:        Isolation Precautions Airborne precautions  Medications Medications  lactated ringers infusion ( Intravenous New Bag/Given 06/06/23 1728)  aspirin EC tablet 81 mg (has no administration in time range)  pravastatin (PRAVACHOL) tablet 10 mg (has no administration in time range)  levothyroxine (SYNTHROID) tablet 25 mcg (has no administration in time range)  pantoprazole (PROTONIX) EC tablet 40 mg (has no administration in time range)  remdesivir 200 mg in sodium chloride 0.9% 250 mL IVPB (has no administration in time range)    Followed by  remdesivir 100 mg in sodium chloride 0.9 % 100 mL IVPB (has no  administration in time range)  enoxaparin (LOVENOX) injection 40 mg (has no administration in time range)  acetaminophen (TYLENOL) tablet 650 mg (has no administration in time range)    Or  acetaminophen (TYLENOL) suppository 650 mg (has no administration in time range)  ondansetron (ZOFRAN) tablet 4 mg (has no administration in time range)    Or  ondansetron (ZOFRAN) injection 4 mg (has no administration in time range)  albuterol (PROVENTIL) (2.5 MG/3ML) 0.083% nebulizer solution 2.5 mg (has no administration in time range)  ceFEPIme (MAXIPIME) 2 g in sodium chloride 0.9 % 100 mL IVPB (0 g Intravenous Stopped 06/06/23 1818)  metroNIDAZOLE (FLAGYL) IVPB 500 mg (0 mg Intravenous Stopped 06/06/23 1913)  lactated ringers bolus 1,000 mL (0 mLs Intravenous Stopped 06/06/23 1913)  vancomycin (VANCOREADY) IVPB 1250 mg/250 mL (0 mg Intravenous Stopped 06/06/23 1938)  acetaminophen (TYLENOL) suppository 650 mg (650 mg Rectal Given 06/06/23 1746)    Mobility walks with device     Focused Assessments  Sepsis workup/Covid-UTI   R Recommendations: See Admitting Provider Note  Report given to:   Additional Notes:

## 2023-06-07 NOTE — Assessment & Plan Note (Signed)
A1c 7.5%, glucose here controlled - Start SS corrections  - Continue aspirin, pravastatin - Hold metformin

## 2023-06-07 NOTE — Assessment & Plan Note (Addendum)
Presented with fever, found to have UTI and COVID.  HR, RR and WBC normal, sepsis ruled out.  Given remdesivir x1, given stability further antivirals were held.  Although at baseline she lives with her grandson and is independent with self cares, on admission here she was disoriented and incoherent.  This is much better today.  UCx growing pansensitive Ecoli. - Continue antibiotics, narrow to cefadroxil with plans for 5 days - PT/OT - Standard delirium precautions: blinds open and lights on during day, TV off, minimize interruptions at night, glasses/hearing aids, PT/OT, avoiding Beers list medications

## 2023-06-07 NOTE — ED Provider Notes (Signed)
  Physical Exam  BP (!) 129/54   Pulse (!) 59   Temp 98.9 F (37.2 C) (Oral)   Resp 19   Wt 61.2 kg   SpO2 96%   BMI 24.69 kg/m   Physical Exam  Procedures  Procedures  ED Course / MDM    Medical Decision Making Amount and/or Complexity of Data Reviewed Labs: ordered. Radiology: ordered. ECG/medicine tests: ordered.  Risk OTC drugs. Prescription drug management. Decision regarding hospitalization.   Patient care accepted from previous provider at shift handoff.  Patient with sepsis criteria, UTI, positive COVID test and altered mental status.  Awaiting consult with hospitalist service for patient admission  I discussed the case with Dr. Maisie Fus who agreed to see the patient for admission.       Darrick Grinder, PA-C 06/07/23 0057    Sabas Sous, MD 06/07/23 [phone]

## 2023-06-08 DIAGNOSIS — G9341 Metabolic encephalopathy: Secondary | ICD-10-CM | POA: Diagnosis not present

## 2023-06-08 LAB — CBC
HCT: 35.4 % — ABNORMAL LOW (ref 36.0–46.0)
Hemoglobin: 11.7 g/dL — ABNORMAL LOW (ref 12.0–15.0)
MCH: 29.7 pg (ref 26.0–34.0)
MCHC: 33.1 g/dL (ref 30.0–36.0)
MCV: 89.8 fL (ref 80.0–100.0)
Platelets: 158 10*3/uL (ref 150–400)
RBC: 3.94 MIL/uL (ref 3.87–5.11)
RDW: 12.7 % (ref 11.5–15.5)
WBC: 2.6 10*3/uL — ABNORMAL LOW (ref 4.0–10.5)
nRBC: 0 % (ref 0.0–0.2)

## 2023-06-08 LAB — GLUCOSE, CAPILLARY
Glucose-Capillary: 84 mg/dL (ref 70–99)
Glucose-Capillary: 141 mg/dL — ABNORMAL HIGH (ref 70–99)
Glucose-Capillary: 146 mg/dL — ABNORMAL HIGH (ref 70–99)
Glucose-Capillary: 137 mg/dL — ABNORMAL HIGH (ref 70–99)

## 2023-06-08 LAB — COMPREHENSIVE METABOLIC PANEL
ALT: 12 U/L (ref 0–44)
AST: 17 U/L (ref 15–41)
Albumin: 3.1 g/dL — ABNORMAL LOW (ref 3.5–5.0)
Alkaline Phosphatase: 47 U/L (ref 38–126)
Anion gap: 5 (ref 5–15)
BUN: 18 mg/dL (ref 8–23)
CO2: 27 mmol/L (ref 22–32)
Calcium: 9.4 mg/dL (ref 8.9–10.3)
Chloride: 104 mmol/L (ref 98–111)
Creatinine, Ser: 1.12 mg/dL — ABNORMAL HIGH (ref 0.44–1.00)
GFR, Estimated: 48 mL/min — ABNORMAL LOW (ref >60)
Glucose, Bld: 122 mg/dL — ABNORMAL HIGH (ref 70–99)
Potassium: 3.4 mmol/L — ABNORMAL LOW (ref 3.5–5.1)
Sodium: 136 mmol/L (ref 135–145)
Total Bilirubin: 0.4 mg/dL (ref 0.3–1.2)
Total Protein: 6.4 g/dL — ABNORMAL LOW (ref 6.5–8.1)

## 2023-06-08 LAB — MAGNESIUM: Magnesium: 1.8 mg/dL (ref 1.7–2.4)

## 2023-06-08 LAB — URINE CULTURE: Culture: >=100000 — AB

## 2023-06-08 MED ORDER — CEFADROXIL 500 MG PO CAPS
1000.0000 mg | ORAL_CAPSULE | Freq: Two times a day (BID) | ORAL | Status: DC
  Administered 2023-06-08 – 2023-06-09 (×3): 1000 mg via ORAL
  Filled 2023-06-08 (×4): qty 2

## 2023-06-08 NOTE — Evaluation (Addendum)
Occupational Therapy Evaluation Patient Details Name: Andrea Gordon MRN: [id number] DOB: 1935-03-10 Today's Date: 06/08/2023   History of Present Illness Pt is an 88 y/o F presenting to ED on 8/25 wtih change in metnal status, low appetite, and n/v episode, found to be COVID+. CT head negative. Admitted for acute metabolic encephalopathy due to UTI and COVID. PMH includes HTN, HLD, DM2, anxiety, diverticulosis, depression, ACDF, bil carpal tunnel release, L TKA   Clinical Impression   Pt reports living with family who can provide PRN assist at home, was using RW at baseline and reports ind with ADLs, family assists with IADLs. Pt currently with decr cognition vs HOH, needing increased cues and repetition of instructions during session. Pt needing set up -mod A for ADLs, CGA for transfers with RW.  Questionable visual deficits, pt running into x3 objects on L side during session, able to self correct, RN notified. Pt with difficulty following directions for visual assessment but pt reports vision is baseline, see vision section for further detail. Pt presenting with impairments listed below, will follow acutely. Recommend HHOT at d/c.       If plan is discharge home, recommend the following: A little help with walking and/or transfers;A little help with bathing/dressing/bathroom;Assistance with cooking/housework;Direct supervision/assist for medications management;Direct supervision/assist for financial management;Assist for transportation;Supervision due to cognitive status    Functional Status Assessment  Patient has had a recent decline in their functional status and demonstrates the ability to make significant improvements in function in a reasonable and predictable amount of time.  Equipment Recommendations  Tub/shower seat (if she does not have one already)    Recommendations for Other Services PT consult     Precautions / Restrictions Precautions Precautions: Fall Precaution Comments:  COVID+ Restrictions Weight Bearing Restrictions: No      Mobility Bed Mobility               General bed mobility comments: OOB in chair upon arrival and departure    Transfers Overall transfer level: Needs assistance Equipment used: Rolling walker (2 wheels) Transfers: Sit to/from Stand Sit to Stand: Contact guard assist           General transfer comment: directional cues, pt running into bed, chair,and doorframe on L side, pt able to self-correct      Balance Overall balance assessment: Needs assistance Sitting-balance support: Bilateral upper extremity supported Sitting balance-Leahy Scale: Good Sitting balance - Comments: reaches outside BOS without LOB   Standing balance support: During functional activity, Reliant on assistive device for balance Standing balance-Leahy Scale: Fair Standing balance comment: static stands without external support                           ADL either performed or assessed with clinical judgement   ADL Overall ADL's : Needs assistance/impaired Eating/Feeding: Set up;Sitting   Grooming: Wash/dry face;Standing;Contact guard assist   Upper Body Bathing: Minimal assistance;Sitting   Lower Body Bathing: Sitting/lateral leans;Minimal assistance;Moderate assistance Lower Body Bathing Details (indicate cue type and reason): washing thighs seated Upper Body Dressing : Minimal assistance;Sitting   Lower Body Dressing: Minimal assistance;Moderate assistance Lower Body Dressing Details (indicate cue type and reason): able to don/doff socks independently, mod A for donning mesh underwear, overall minA Toilet Transfer: Contact guard assist;Ambulation;Rolling walker (2 wheels);Regular Toilet   Toileting- Clothing Manipulation and Hygiene: Supervision/safety;Sitting/lateral lean       Functional mobility during ADLs: Contact guard assist;Rolling walker (2 wheels)  Vision Baseline Vision/History: 1 Wears  glasses Patient Visual Report: No change from baseline Vision Assessment?: Yes Eye Alignment: Within Functional Limits Ocular Range of Motion: Restricted on the left;Impaired-to be further tested in functional context Alignment/Gaze Preference: Within Defined Limits Tracking/Visual Pursuits: Decreased smoothness of horizontal tracking;Decreased smoothness of eye movement to LEFT superior field Saccades: Impaired - to be further tested in functional context;Other (comment) (difficulty following instructions for task) Visual Fields: Impaired-to be further tested in functional context Additional Comments: decr tracking to L side, running into objects x3 on L side of room with RW, when washing hands at sink, pt does not turn of hot water (on L side) and reports "I didn't know that was there". Attends more to R side stimulus vs L side when presented with stimuli  on both sides     Perception Perception: Not tested       Praxis Praxis: Not tested       Pertinent Vitals/Pain Pain Assessment Pain Assessment: No/denies pain     Extremity/Trunk Assessment Upper Extremity Assessment Upper Extremity Assessment: Generalized weakness   Lower Extremity Assessment Lower Extremity Assessment: Defer to PT evaluation   Cervical / Trunk Assessment Cervical / Trunk Assessment: Normal   Communication Communication Communication: Difficulty following commands/understanding;Hearing impairment   Cognition Arousal: Alert Behavior During Therapy: Anxious, WFL for tasks assessed/performed Overall Cognitive Status: No family/caregiver present to determine baseline cognitive functioning                                 General Comments: pt needing increased cues, due to cognition vs HOH possibly, difficulty following simple instructions for visual assessment, needs repetition of instructions during session. Unaware of why she is in the hospital,does not know what COVID is     General  Comments  VSS on RA    Exercises     Shoulder Instructions      Home Living Family/patient expects to be discharged to:: Private residence Living Arrangements: Other (Comment);Children (adult grandchild) Available Help at Discharge: Family;Available PRN/intermittently Type of Home: House Home Access: Level entry     Home Layout: One level     Bathroom Shower/Tub: Tub/shower unit         Home Equipment: Agricultural consultant (2 wheels);Shower seat (shower chair questionable)          Prior Functioning/Environment Prior Level of Function : Independent/Modified Independent             Mobility Comments: patient uses RW at baseline. She is alone part of the day while grandson is at work ADLs Comments: independent        OT Problem List: Decreased strength;Decreased range of motion;Decreased activity tolerance;Impaired balance (sitting and/or standing);Impaired vision/perception;Decreased cognition;Decreased safety awareness      OT Treatment/Interventions: Self-care/ADL training;Therapeutic exercise;Energy conservation;DME and/or AE instruction;Therapeutic activities;Visual/perceptual remediation/compensation;Patient/family education;Balance training    OT Goals(Current goals can be found in the care plan section) Acute Rehab OT Goals Patient Stated Goal: none stated OT Goal Formulation: With patient Time For Goal Achievement: 06/22/23 Potential to Achieve Goals: Good ADL Goals Pt Will Perform Upper Body Dressing: with modified independence;sitting Pt Will Perform Lower Body Dressing: with modified independence;sit to/from stand;sitting/lateral leans Pt Will Transfer to Toilet: with modified independence;ambulating;regular height toilet Additional ADL Goal #1: Pt will utilize scanning techniques and gather items on L side with min cues in order to promote ind with ADLs Additional ADL Goal #2: pt will follow 3  step command with min cues in prep for ADLs  OT Frequency:  Min 1X/week    Co-evaluation              AM-PAC OT "6 Clicks" Daily Activity     Outcome Measure Help from another person eating meals?: None Help from another person taking care of personal grooming?: A Little Help from another person toileting, which includes using toliet, bedpan, or urinal?: A Little Help from another person bathing (including washing, rinsing, drying)?: A Lot Help from another person to put on and taking off regular upper body clothing?: A Little Help from another person to put on and taking off regular lower body clothing?: A Lot 6 Click Score: 17   End of Session Equipment Utilized During Treatment: Gait belt;Rolling walker (2 wheels) Nurse Communication: Mobility status (decr cognition)  Activity Tolerance: Patient tolerated treatment well Patient left: in chair;with call bell/phone within reach;with chair alarm set  OT Visit Diagnosis: Unsteadiness on feet (R26.81);Other abnormalities of gait and mobility (R26.89);Muscle weakness (generalized) (M62.81)                Time: 1610-9604 OT Time Calculation (min): 45 min Charges:  OT General Charges $OT Visit: 1 Visit OT Evaluation $OT Eval Moderate Complexity: 1 Mod OT Treatments $Self Care/Home Management : 8-22 mins $Therapeutic Activity: 8-22 mins  Carver Fila, OTD, OTR/L SecureChat Preferred Acute Rehab [phone]  Dalphine Handing 06/08/2023, 4:43 PM

## 2023-06-08 NOTE — Progress Notes (Signed)
Mobility Specialist Progress Note:    06/08/23 1500  Mobility  Activity Ambulated with assistance in room  Level of Assistance Minimal assist, patient does 75% or more  Assistive Device Front wheel walker  Distance Ambulated (ft) 20 ft  Activity Response Tolerated well  Mobility Referral Yes  $Mobility charge 1 Mobility  Mobility Specialist Start Time (ACUTE ONLY) 1359  Mobility Specialist Stop Time (ACUTE ONLY) 1414  Mobility Specialist Time Calculation (min) (ACUTE ONLY) 15 min   Pt received in bed, agreeable to ambulate. Pt needed MinA w/ bed mobility and STS, contact guard during ambulation. Pt was initially wet when entering room so mobility specialist performed a gown change, stripped the bed, and replaced purewick. Pt was able to walk to the door and to her recliner w/o fault. Situated in chair w/ call bell and personal belongings in reach. All needs met.  Thompson Grayer Mobility Specialist  Please contact vis Secure Chat or  Rehab Office [phone]

## 2023-06-08 NOTE — Plan of Care (Signed)
  Problem: Respiratory: Goal: Will maintain a patent airway Outcome: Progressing   Problem: Clinical Measurements: Goal: Will remain free from infection Outcome: Progressing   Problem: Pain Managment: Goal: General experience of comfort will improve Outcome: Progressing   Problem: Safety: Goal: Ability to remain free from injury will improve Outcome: Progressing

## 2023-06-08 NOTE — Plan of Care (Signed)
  Problem: Education: Goal: Knowledge of risk factors and measures for prevention of condition will improve Outcome: Progressing   

## 2023-06-08 NOTE — Progress Notes (Addendum)
  Progress Note   Patient: Andrea Gordon ZOX:[id number] DOB: [birth year] DOA: 06/06/2023     1 DOS: the patient was seen and examined on 06/08/2023 at 8:57AM      Brief hospital course: Andrea Gordon is an 88 y.o. F with DM, HTN, hypothyroidism and CKD IIIb baseline 1.3 who presented with confusion, found to have COVID and UTI.     Assessment and Plan: * Acute metabolic encephalopathy due to UTI and COVID Presented with fever, found to have UTI and COVID.  HR, RR and WBC normal, sepsis ruled out.  Given remdesivir x1, given stability further antivirals were held.  Although at baseline she lives with her grandson and is independent with self cares, on admission here she was disoriented and incoherent.  This is much better today.  UCx growing pansensitive Ecoli. - Continue antibiotics, narrow to cefadroxil with plans for 5 days - PT/OT - Standard delirium precautions: blinds open and lights on during day, TV off, minimize interruptions at night, glasses/hearing aids, PT/OT, avoiding Beers list medications      Hypothyroidism TSH normal 2 months ago - Continue levothyroxine  Anxiety Appears she was on BID Xanax until around last fall, then tapered down and the off of it completely for about 6 months until 1 month refill two months ago.   - Avoid benzodiazepines given #1 above  Essential hypertension BP normal - Hold atenolol-chlorthalidone   Hyperlipidemia - Continue pravastatin  DMII (diabetes mellitus, type 2) (HCC) A1c 7.5%, glucose here controlled - Continue SS corrections - Continue aspirin, pravastatin - Hold metformin          Subjective: Improving, oriented to self, [facility] hospital, staff.  No fever, no respiratory symptoms, no hypoxia.  Still somewhat confused and tangential but overall better.  Generally weak.     Physical Exam: BP 114/76 (BP Location: Left Arm)   Pulse (!) 57   Temp 97.8 F (36.6 C) (Oral)   Resp 20   Wt 64.1 kg   SpO2 96%   BMI  25.85 kg/m   Elderly adult female, lying in bed, interactive, weak RRR, no murmurs, no peripheral edema Respiratory rate normal, lungs clear without rales or wheezes Abdomen soft without tenderness palpation or guarding Attentive to questions, short-term memory impairment is subtle, she has generalized weakness, face symmetric, speech fluent    Data Reviewed: Urine culture growing pansensitive E. coli Basic metabolic panel shows creatinine 1.1, potassium 3.4 CBC shows white count down to 2.6, hemoglobin 11.7 LFTs normal  Family Communication: Spoke with grandson by phone    Disposition: Status is: Inpatient The patient was admitted with encephalopathy due to UTI and COVID  This is improving, and she is closer to baseline, but still somewhat confused.  I suspect she will be stronger and more alert tomorrow and able to d/c tomorrow with HH.          Author: Alberteen Sam, MD 06/08/2023 10:53 AM  For on call review [email or web address].

## 2023-06-08 NOTE — TOC Initial Note (Signed)
Transition of Care [facility]) - Initial/Assessment Note    Patient Details  Name: Andrea Gordon MRN: [id number] Date of Birth: November 17, 1934  Transition of Care [facility]) CM/SW Contact:    Leone Haven, RN Phone Number: 06/08/2023, 3:07 PM  Clinical Narrative:                 From home with grandson, has PCP and insurance on file, states has no HH services in place at this time, she states she has a walker and a w/chair.  She gave this NCM permission to speak with grandson.  He will transport her home at dc and he is support system, states gets medications from [facility] in [place].  Pta  ambulatory with walker. NCM offered choice to Molli Hazard her grandson, he states he has no preference.  NCM made referral to [facility] with [facility] for HHPT. He is able to take referral . Soc will begin 24 to 48 hrs post dc.    Expected Discharge Plan: Home w Home Health Services Barriers to Discharge: Continued Medical Work up   Patient Goals and CMS Choice Patient states their goals for this hospitalization and ongoing recovery are:: return home with grandson          Expected Discharge Plan and Services In-house Referral: NA Discharge Planning Services: CM Consult Post Acute Care Choice: Home Health Living arrangements for the past 2 months: Single Family Home                 DME Arranged: N/A DME Agency: NA       HH Arranged: PT HH AgencyHotel manager Home Health Care Date [facility] Agency Contacted: 06/08/23 Time HH Agency Contacted: 1506 Representative spoke with at [facility] Agency: Kandee Keen  Prior Living Arrangements/Services Living arrangements for the past 2 months: Single Family Home Lives with:: Adult Children (grandson) Patient language and need for interpreter reviewed:: Yes Do you feel safe going back to the place where you live?: Yes      Need for Family Participation in Patient Care: Yes (Comment) Care giver support system in place?: Yes (comment) Current home services: DME (walker,  w/chair) Criminal Activity/Legal Involvement Pertinent to Current Situation/Hospitalization: No - Comment as needed  Activities of Daily Living      Permission Sought/Granted Permission sought to share information with : Case Manager Permission granted to share information with : Yes, Verbal Permission Granted  Share Information with NAME: Molli Hazard  Permission granted to share info w AGENCY: [facility]  Permission granted to share info w Relationship: grandson     Emotional Assessment   Attitude/Demeanor/Rapport: Engaged Affect (typically observed): Appropriate Orientation: : Oriented to Self, Oriented to Place, Oriented to  Time, Oriented to Situation Alcohol / Substance Use: Not Applicable Psych Involvement: No (comment)  Admission diagnosis:  Sepsis secondary to UTI (HCC) [A41.9, N39.0] Sepsis, due to unspecified organism, unspecified whether acute organ dysfunction present [facility]) [A41.9] Patient Active Problem List   Diagnosis Date Noted   Acute metabolic encephalopathy due to UTI and COVID 06/07/2023   DMII (diabetes mellitus, type 2) (HCC) 06/07/2023   Hyperlipidemia 06/07/2023   Essential hypertension 06/07/2023   Anxiety 06/07/2023   Hypothyroidism 06/07/2023   Stenosis of cervical spine with myelopathy (HCC) 01/06/2018   Chest pain 07/29/2012   PCP:  Leane Call, PA-C Pharmacy:   [facility], [place] - [address] [place] [address] Phone: [phone] Fax: [phone]  [facility] - [place], [place] -  [address] [place] [address] Phone: [phone] Fax: [phone]     Social Determinants of Health (SDOH) Social History: SDOH Screenings   Tobacco Use: Low Risk  (04/08/2023)   Received from [facility]   SDOH Interventions:     Readmission Risk Interventions     No data to display

## 2023-06-09 ENCOUNTER — Other Ambulatory Visit (HOSPITAL_COMMUNITY): Payer: Self-pay

## 2023-06-09 DIAGNOSIS — G9341 Metabolic encephalopathy: Secondary | ICD-10-CM | POA: Diagnosis not present

## 2023-06-09 LAB — GLUCOSE, CAPILLARY
Glucose-Capillary: 124 mg/dL — ABNORMAL HIGH (ref 70–99)
Glucose-Capillary: 149 mg/dL — ABNORMAL HIGH (ref 70–99)
Glucose-Capillary: 120 mg/dL — ABNORMAL HIGH (ref 70–99)

## 2023-06-09 MED ORDER — CEFADROXIL 500 MG PO CAPS
1000.0000 mg | ORAL_CAPSULE | Freq: Two times a day (BID) | ORAL | 0 refills | Status: AC
  Filled 2023-06-09: qty 6, 2d supply, fill #0

## 2023-06-09 NOTE — Discharge Summary (Signed)
Physician Discharge Summary  Andrea Gordon ZOX:[id number] DOB: [birth year] DOA: 06/06/2023  PCP: Leane Call, PA-C  Admit date: 06/06/2023 Discharge date: 06/09/2023  Admitted From: Home Disposition: Home  Recommendations for Outpatient Follow-up:  Follow up with PCP in 1-2 weeks Continue with cefadroxil to complete 5-day course of antibiotics for E. coli UTI Discontinued atenolol-chlorthalidone due to bradycardia/borderline hypotension If develops postprandial abdominal pain or unexplained weight loss, may need to consider CT angiogram abdomen/pelvis for further workup for chronic mesenteric ischemia, although patient asymptomatic currently  Home Health: PT  Equipment/Devices: None  Discharge Condition: Stable CODE STATUS: Full code Diet recommendation: Heart healthy/consistent carbohydrate diet  History of present illness:  Andrea Gordon is an 88 year old female with past medical history significant for hypothyroidism, essential hypertension, type 2 diabetes mellitus, GERD, HLD, CKD stage IIIb who presented to [facility] ED on 8/25 with confusion and found to have COVID-19 viral infection and urinary tract infection.  Hospital course:  Acute metabolic encephalopathy secondary to E. coli UTI/COVID viral infection Sepsis ruled out Patient presenting to ED with confusion, fever.  Workup revealing E. coli UTI and Covid viral infection.  CT head without contrast with no acute findings.  Chest x-ray with no acute cardiopulmonary disease process.  Patient received remdesivir x 1 and given stability further antivirals were held.  Patient was also initially started on vancomycin, cefepime and metronidazole and urine culture positive for E. coli and antibiotics were de-escalated to cefadroxil.  Will continue cefadroxil to complete 5-day course.  Abnormal CT findings Noted substantial atheromatous plaque proximal SMA; patency not evaluated.  Patient denies any weight loss or  postprandial abdominal pain to suggest chronic mesenteric ischemia.  If starts to develop symptoms recommend CTA abdomen/pelvis.  Hypothyroidism TSH 2.395 04/08/2023.  Continue levothyroxine  CKD stage IIIb Baseline creatinine 1.3.  Creatinine 1.12 at time of discharge.  Essential hypertension Blood pressure and heart rate borderline low.  On atenolol/chlorthalidone at home, now discontinued.  Continue aspirin and statin.  Outpatient follow-up with PCP for further guidance and monitoring.  Hyperlipidemia Continue pravastatin  Type 2 diabetes mellitus Hemoglobin A1c 7.5.  Continue metformin.  Discharge Diagnoses:  Principal Problem:   Acute metabolic encephalopathy due to UTI and COVID Active Problems:   DMII (diabetes mellitus, type 2) (HCC)   Hyperlipidemia   Essential hypertension   Anxiety   Hypothyroidism    Discharge Instructions  Discharge Instructions     Call MD for:  difficulty breathing, headache or visual disturbances   Complete by: As directed    Call MD for:  extreme fatigue   Complete by: As directed    Call MD for:  persistant dizziness or light-headedness   Complete by: As directed    Call MD for:  persistant nausea and vomiting   Complete by: As directed    Call MD for:  severe uncontrolled pain   Complete by: As directed    Call MD for:  temperature >100.4   Complete by: As directed    Diet - low sodium heart healthy   Complete by: As directed    Increase activity slowly   Complete by: As directed       Allergies as of 06/09/2023       Reactions   Shellfish Allergy Anaphylaxis, Swelling   Tongue swelled   Codeine    Stomach upset   Contrast Media [iodinated Contrast Media] Other (See Comments)   Pt cannot remember what occurred.   Omeprazole  Makes nervous        Medication List     STOP taking these medications    atenolol-chlorthalidone 50-25 MG tablet Commonly known as: TENORETIC       TAKE these medications     aspirin EC 81 MG tablet Take 81 mg by mouth daily.   cefadroxil 500 MG capsule Commonly known as: DURICEF Take 2 capsules (1,000 mg total) by mouth 2 (two) times daily for 3 doses.   levothyroxine 25 MCG tablet Commonly known as: SYNTHROID Take 25 mcg by mouth daily before breakfast.   metFORMIN 500 MG 24 hr tablet Commonly known as: GLUCOPHAGE-XR Take 500 mg by mouth daily with breakfast.   pantoprazole 40 MG tablet Commonly known as: PROTONIX Take 40 mg by mouth daily.   potassium chloride 10 MEQ tablet Commonly known as: KLOR-CON Take 10 mEq by mouth daily.   pravastatin 10 MG tablet Commonly known as: PRAVACHOL Take 10 mg by mouth every evening.        Follow-up Information     [facility] Follow up.   Specialty: Home Health Services Why: Agency will call you to set up apt times Contact information: [address] [place] [address] [phone]         Nodal, Joline Salt, PA-C. Schedule an appointment as soon as possible for a visit in 1 week(s).   Specialty: Physician Assistant Contact information: [address] [place] [address] [phone]                Allergies  Allergen Reactions   Shellfish Allergy Anaphylaxis and Swelling    Tongue swelled   Codeine     Stomach upset   Contrast Media [Iodinated Contrast Media] Other (See Comments)    Pt cannot remember what occurred.   Omeprazole     Makes nervous    Consultations: None   Procedures/Studies: CT HEAD WO CONTRAST ( )  Result Date: 06/07/2023 CLINICAL DATA:  Mental status change EXAM: CT HEAD WITHOUT CONTRAST TECHNIQUE: Contiguous axial images were obtained from the base of the skull through the vertex without intravenous contrast. RADIATION DOSE REDUCTION: This exam was performed according to the departmental dose-optimization program which includes automated exposure control, adjustment of the mA and/or kV according to patient  size and/or use of iterative reconstruction technique. COMPARISON:  09/28/2019 FINDINGS: Brain: No evidence of acute infarction, hemorrhage, mass, mass effect, or midline shift. No hydrocephalus or extra-axial fluid collection. Normal cerebral volume for age. Gray-white differentiation is preserved. The basilar cisterns are patent. Vascular: No hyperdense vessel. Skull: Negative for fracture or focal lesion. Sinuses/Orbits: Mucosal thickening in the right frontal sinus, bilateral ethmoid air cells, and right maxillary sinus. Status post bilateral lens replacements. Other: The mastoid air cells are well aerated. IMPRESSION: No acute intracranial process. Electronically Signed   By: Wiliam Ke M.D.   On: 06/07/2023 02:29   CT ABDOMEN PELVIS WO CONTRAST  Result Date: 06/06/2023 CLINICAL DATA:  Acute abdominal pain.  Fever. EXAM: CT ABDOMEN AND PELVIS WITHOUT CONTRAST TECHNIQUE: Multidetector CT imaging of the abdomen and pelvis was performed following the standard protocol without IV contrast. RADIATION DOSE REDUCTION: This exam was performed according to the departmental dose-optimization program which includes automated exposure control, adjustment of the mA and/or kV according to patient size and/or use of iterative reconstruction technique. COMPARISON:  02/01/2021 FINDINGS: Lower chest: Left anterior descending and right coronary artery atherosclerosis with descending thoracic aortic atheromatous vascular disease. Mild cardiomegaly.  Bandlike opacity at the left lung base favoring atelectasis. Borderline elevated left hemidiaphragm. Hepatobiliary: Lobularity of the left hepatic lobe anteriorly is likely mostly due to the hernia mesh adjacent to the liver margin. There is some prominence of the lateral segment left hepatic lobe which could be a early indicator of cirrhosis. Cholecystectomy noted. No biliary dilatation. Pancreas: Unremarkable Spleen: Unremarkable Adrenals/Urinary Tract: Unremarkable  Stomach/Bowel: Scattered colonic diverticula. No findings of acute diverticulitis. Normal appendix. Suspected partial colectomy. Borderline prominence of gas and stool in rectal vault without wall thickening to suggest stercoral colitis. No dilated small bowel. Vascular/Lymphatic: Atherosclerosis is present, including aortoiliac atherosclerotic disease. There is substantial atheromatous plaque proximally in the superior mesenteric artery. Ill definition of the celiac trunk, possible common origin of the celiac trunk and SMA. Patency of this vessel is not assessed on today's noncontrast exam but the plaque is expected to at least cause substantial stenosis. If the patient were to have postprandial abdominal pain or unexplained weight loss then the possibility of chronic mesenteric ischemia might be considered and CT angiographic workup within be suggested. Reproductive: Unremarkable Other: No supplemental non-categorized findings. Musculoskeletal: Large anterior abdominal wall hernia mesh. No recurrent hernia. Potential low position of the anorectal junction suggesting pelvic floor laxity. Extensive lower thoracic and lumbar spondylosis and degenerative disc disease with multilevel impingement. Bony demineralization. IMPRESSION: 1. No specific acute findings are identified to explain the patient's abdominal pain. 2. Substantial atheromatous plaque in the proximal superior mesenteric artery, with ill definition of the celiac trunk, possible common origin of the celiac trunk and SMA. Patency of this vessel is not assessed on today's noncontrast exam but the plaque is expected to at least cause substantial stenosis. If the patient were to have postprandial abdominal pain or unexplained weight loss then the possibility of chronic mesenteric ischemia might be considered and CT angiographic workup within be suggested. 3. Aortic atherosclerosis. 4. Mild cardiomegaly. 5. Bandlike opacity at the left lung base favoring  atelectasis. 6. Large anterior abdominal wall hernia mesh. 7. Potential low position of the anorectal junction suggesting pelvic floor laxity. 8. Extensive lower thoracic and lumbar spondylosis and degenerative disc disease with multilevel impingement. 9. Lobularity of the left hepatic lobe anteriorly is likely mostly due to the hernia mesh adjacent to the liver margin. There is some prominence of the lateral segment left hepatic lobe which could be a early indicator of cirrhosis. Aortic Atherosclerosis (ICD10-I70.0). Electronically Signed   By: Gaylyn Rong M.D.   On: 06/06/2023 21:01   DG Chest Port 1 View  Result Date: 06/06/2023 CLINICAL DATA:  Sepsis.  Altered mental status. EXAM: PORTABLE CHEST 1 VIEW COMPARISON:  X-ray 03/28/2022 FINDINGS: Slight elevation of the left hemidiaphragm is stable. No consolidation, pneumothorax or effusion. Slight left basilar scar or atelectasis. Normal cardiopericardial silhouette. Calcified aorta. Film is rotated to the left. Degenerative changes are seen with osteopenia. Fixation hardware along the lower cervical spine at the edge of the imaging field. Right shoulder reverse arthroplasty. Overlapping cardiac leads. IMPRESSION: Stable slight elevation of the left hemidiaphragm with some adjacent scar or atelectasis. No acute cardiopulmonary disease Electronically Signed   By: Karen Kays M.D.   On: 06/06/2023 19:06     Subjective: Patient seen examined bedside, resting comfortable.  Eating breakfast.  No specific complaints this morning.  Ready for discharge home.  Updated patient's grandson via telephone.  Denies headache, no dizziness, no chest pain, no shortness of breath, no abdominal pain, no fever, no dizziness.  No acute events  overnight per nursing sta  Discharge Exam: Vitals:   06/09/23 0450 06/09/23 0841  BP: 115/74 (!) 115/55  Pulse: (!) 56 (!) 54  Resp: 18 18  Temp: 97.7 F (36.5 C) 97.6 F (36.4 C)  SpO2: 99% 95%   Vitals:   06/09/23  0010 06/09/23 0450 06/09/23 0553 06/09/23 0841  BP: 122/82 115/74  (!) 115/55  Pulse:  (!) 56  (!) 54  Resp:  18  18  Temp:  97.7 F (36.5 C)  97.6 F (36.4 C)  TempSrc:  Oral  Oral  SpO2:  99%  95%  Weight:   63.8 kg     Physical Exam: GEN: NAD, alert and oriented x 3, elderly in appearance HEENT: NCAT, PERRL, EOMI, sclera clear, MMM PULM: CTAB w/o wheezes/crackles, normal respiratory effort, on room air CV: RRR w/o M/G/R GI: abd soft, NTND, NABS, no R/G/M MSK: no peripheral edema, muscle strength globally intact 5/5 bilateral upper/lower extremities NEURO: No focal neurological deficits PSYCH: normal mood/affect Integumentary: No concerning rashes/lesions/wounds noted in exposed skin services.    The results of significant diagnostics from this hospitalization (including imaging, microbiology, ancillary and laboratory) are listed below for reference.     Microbiology: Recent Results (from the past 240 hour(s))  Resp panel by RT-PCR (RSV, Flu A&B, Covid) Anterior Nasal Swab     Status: Abnormal   Collection Time: 06/06/23  4:55 PM   Specimen: Anterior Nasal Swab  Result Value Ref Range Status   SARS Coronavirus 2 by RT PCR POSITIVE (A) NEGATIVE Final   Influenza A by PCR NEGATIVE NEGATIVE Final   Influenza B by PCR NEGATIVE NEGATIVE Final    Comment: (NOTE) The Xpert Xpress SARS-CoV-2/FLU/RSV plus assay is intended as an aid in the diagnosis of influenza from Nasopharyngeal swab specimens and should not be used as a sole basis for treatment. Nasal washings and aspirates are unacceptable for Xpert Xpress SARS-CoV-2/FLU/RSV testing.  Fact Sheet for Patients: BloggerCourse.com  Fact Sheet for Healthcare Providers: SeriousBroker.it  This test is not yet approved or cleared by the [place] FDA and has been authorized for detection and/or diagnosis of SARS-CoV-2 by FDA under an Emergency Use Authorization (EUA). This  EUA will remain in effect (meaning this test can be used) for the duration of the COVID-19 declaration under Section 564(b)(1) of the Act, 21 U.S.C. section 360bbb-3(b)(1), unless the authorization is terminated or revoked.     Resp Syncytial Virus by PCR NEGATIVE NEGATIVE Final    Comment: (NOTE) Fact Sheet for Patients: BloggerCourse.com  Fact Sheet for Healthcare Providers: SeriousBroker.it  This test is not yet approved or cleared by the [place] FDA and has been authorized for detection and/or diagnosis of SARS-CoV-2 by FDA under an Emergency Use Authorization (EUA). This EUA will remain in effect (meaning this test can be used) for the duration of the COVID-19 declaration under Section 564(b)(1) of the Act, 21 U.S.C. section 360bbb-3(b)(1), unless the authorization is terminated or revoked.  Performed at [facility], [address] [place], [place] [address]   Blood Culture (routine x 2)     Status: None (Preliminary result)   Collection Time: 06/06/23  5:00 PM   Specimen: BLOOD  Result Value Ref Range Status   Specimen Description BLOOD SITE NOT SPECIFIED  Final   Special Requests   Final    BOTTLES DRAWN AEROBIC AND ANAEROBIC Blood Culture adequate volume   Culture   Final    NO GROWTH 3 DAYS Performed at  [facility], [address] [place], [place] [address]    Report Status PENDING  Incomplete  Urine Culture     Status: Abnormal   Collection Time: 06/06/23  5:48 PM   Specimen: Urine, Random  Result Value Ref Range Status   Specimen Description URINE, RANDOM  Final   Special Requests   Final    NONE Reflexed from [id number] Performed at [facility], [address], [place], [place] [address]    Culture >=100,000 COLONIES/mL ESCHERICHIA COLI (A)  Final   Report Status 06/08/2023 FINAL  Final   Organism ID, Bacteria ESCHERICHIA COLI (A)  Final      Susceptibility   Escherichia coli -  MIC*    AMPICILLIN >=32 RESISTANT Resistant     CEFAZOLIN <=4 SENSITIVE Sensitive     CEFEPIME <=0.12 SENSITIVE Sensitive     CEFTRIAXONE <=0.25 SENSITIVE Sensitive     CIPROFLOXACIN >=4 RESISTANT Resistant     GENTAMICIN <=1 SENSITIVE Sensitive     IMIPENEM <=0.25 SENSITIVE Sensitive     NITROFURANTOIN <=16 SENSITIVE Sensitive     TRIMETH/SULFA <=20 SENSITIVE Sensitive     AMPICILLIN/SULBACTAM 8 SENSITIVE Sensitive     PIP/TAZO <=4 SENSITIVE Sensitive     * >=100,000 COLONIES/mL ESCHERICHIA COLI     Labs: BNP (last 3 results) No results for input(s): "BNP" in the last 8760 hours. Basic Metabolic Panel: Recent Labs  Lab 06/06/23 1719 06/06/23 1739 06/07/23 0445 06/08/23 0620  NA 135 136 136 136  K 4.0 4.5 3.1* 3.4*  CL 101 102 102 104  CO2 22  --  23 27  GLUCOSE 208* 208* 127* 122*  BUN 31* 38* 25* 18  CREATININE 1.30* 1.30* 1.22* 1.12*  CALCIUM 9.6  --  9.1 9.4  MG  --   --  1.3* 1.8  PHOS  --   --  2.5  --    Liver Function Tests: Recent Labs  Lab 06/06/23 1719 06/07/23 0445 06/08/23 0620  AST 23 16 17   ALT 15 12 12   ALKPHOS 58 49 47  BILITOT 1.3* 0.9 0.4  PROT 6.9 5.9* 6.4*  ALBUMIN 3.5 3.0* 3.1*   Recent Labs  Lab 06/06/23 1719  LIPASE 35   No results for input(s): "AMMONIA" in the last 168 hours. CBC: Recent Labs  Lab 06/06/23 1719 06/06/23 1739 06/07/23 0445 06/08/23 0620  WBC 6.3  --  3.9* 2.6*  NEUTROABS 4.7  --   --   --   HGB 12.3 12.2 10.9* 11.7*  HCT 37.8 36.0 33.4* 35.4*  MCV 91.5  --  91.5 89.8  PLT 181  --  139* 158   Cardiac Enzymes: No results for input(s): "CKTOTAL", "CKMB", "CKMBINDEX", "TROPONINI" in the last 168 hours. BNP: Invalid input(s): "POCBNP" CBG: Recent Labs  Lab 06/08/23 0533 06/08/23 1058 06/08/23 1612 06/08/23 2028 06/09/23 0613  GLUCAP 84 141* 146* 137* 124*   D-Dimer No results for input(s): "DDIMER" in the last 72 hours. Hgb A1c Recent Labs    06/07/23 0445  HGBA1C 7.5*   Lipid Profile No  results for input(s): "CHOL", "HDL", "LDLCALC", "TRIG", "CHOLHDL", "LDLDIRECT" in the last 72 hours. Thyroid function studies No results for input(s): "TSH", "T4TOTAL", "T3FREE", "THYROIDAB" in the last 72 hours.  Invalid input(s): "FREET3" Anemia work up No results for input(s): "VITAMINB12", "FOLATE", "FERRITIN", "TIBC", "IRON", "RETICCTPCT" in the last 72 hours. Urinalysis    Component Value Date/Time   COLORURINE YELLOW 06/06/2023 1748   APPEARANCEUR CLOUDY (  A) 06/06/2023 1748   LABSPEC 1.015 06/06/2023 1748   PHURINE 6.0 06/06/2023 1748   GLUCOSEU NEGATIVE 06/06/2023 1748   HGBUR SMALL (A) 06/06/2023 1748   BILIRUBINUR NEGATIVE 06/06/2023 1748   KETONESUR NEGATIVE 06/06/2023 1748   PROTEINUR 30 (A) 06/06/2023 1748   NITRITE NEGATIVE 06/06/2023 1748   LEUKOCYTESUR LARGE (A) 06/06/2023 1748   Sepsis Labs Recent Labs  Lab 06/06/23 1719 06/07/23 0445 06/08/23 0620  WBC 6.3 3.9* 2.6*   Microbiology Recent Results (from the past 240 hour(s))  Resp panel by RT-PCR (RSV, Flu A&B, Covid) Anterior Nasal Swab     Status: Abnormal   Collection Time: 06/06/23  4:55 PM   Specimen: Anterior Nasal Swab  Result Value Ref Range Status   SARS Coronavirus 2 by RT PCR POSITIVE (A) NEGATIVE Final   Influenza A by PCR NEGATIVE NEGATIVE Final   Influenza B by PCR NEGATIVE NEGATIVE Final    Comment: (NOTE) The Xpert Xpress SARS-CoV-2/FLU/RSV plus assay is intended as an aid in the diagnosis of influenza from Nasopharyngeal swab specimens and should not be used as a sole basis for treatment. Nasal washings and aspirates are unacceptable for Xpert Xpress SARS-CoV-2/FLU/RSV testing.  Fact Sheet for Patients: BloggerCourse.com  Fact Sheet for Healthcare Providers: SeriousBroker.it  This test is not yet approved or cleared by the [place] FDA and has been authorized for detection and/or diagnosis of SARS-CoV-2 by FDA under an  Emergency Use Authorization (EUA). This EUA will remain in effect (meaning this test can be used) for the duration of the COVID-19 declaration under Section 564(b)(1) of the Act, 21 U.S.C. section 360bbb-3(b)(1), unless the authorization is terminated or revoked.     Resp Syncytial Virus by PCR NEGATIVE NEGATIVE Final    Comment: (NOTE) Fact Sheet for Patients: BloggerCourse.com  Fact Sheet for Healthcare Providers: SeriousBroker.it  This test is not yet approved or cleared by the [place] FDA and has been authorized for detection and/or diagnosis of SARS-CoV-2 by FDA under an Emergency Use Authorization (EUA). This EUA will remain in effect (meaning this test can be used) for the duration of the COVID-19 declaration under Section 564(b)(1) of the Act, 21 U.S.C. section 360bbb-3(b)(1), unless the authorization is terminated or revoked.  Performed at [facility], [address], [place], [place] [address]   Blood Culture (routine x 2)     Status: None (Preliminary result)   Collection Time: 06/06/23  5:00 PM   Specimen: BLOOD  Result Value Ref Range Status   Specimen Description BLOOD SITE NOT SPECIFIED  Final   Special Requests   Final    BOTTLES DRAWN AEROBIC AND ANAEROBIC Blood Culture adequate volume   Culture   Final    NO GROWTH 3 DAYS Performed at [facility], [address] [place], [place] [address]    Report Status PENDING  Incomplete  Urine Culture     Status: Abnormal   Collection Time: 06/06/23  5:48 PM   Specimen: Urine, Random  Result Value Ref Range Status   Specimen Description URINE, RANDOM  Final   Special Requests   Final    NONE Reflexed from [id number] Performed at [facility], [address], [place], [place] [address]    Culture >=100,000 COLONIES/mL ESCHERICHIA COLI (A)  Final   Report Status 06/08/2023 FINAL  Final   Organism ID, Bacteria ESCHERICHIA COLI (A)  Final       Susceptibility   Escherichia coli - MIC*  AMPICILLIN >=32 RESISTANT Resistant     CEFAZOLIN <=4 SENSITIVE Sensitive     CEFEPIME <=0.12 SENSITIVE Sensitive     CEFTRIAXONE <=0.25 SENSITIVE Sensitive     CIPROFLOXACIN >=4 RESISTANT Resistant     GENTAMICIN <=1 SENSITIVE Sensitive     IMIPENEM <=0.25 SENSITIVE Sensitive     NITROFURANTOIN <=16 SENSITIVE Sensitive     TRIMETH/SULFA <=20 SENSITIVE Sensitive     AMPICILLIN/SULBACTAM 8 SENSITIVE Sensitive     PIP/TAZO <=4 SENSITIVE Sensitive     * >=100,000 COLONIES/mL ESCHERICHIA COLI     Time coordinating discharge: Over 30 minutes  SIGNED:   Alvira Philips [place], DO  [facility] 06/09/2023, 11:12 AM

## 2023-06-09 NOTE — Progress Notes (Signed)
Pt Grandson was given Discharge instructions, Lysle Morales will pick pt up between 5pm-6pm Packet and medications at front desk.

## 2023-06-09 NOTE — Plan of Care (Signed)
  Problem: Coping: Goal: Psychosocial and spiritual needs will be supported Outcome: Progressing   Problem: Clinical Measurements: Goal: Will remain free from infection Outcome: Progressing   Problem: Coping: Goal: Level of anxiety will decrease Outcome: Progressing   Problem: Safety: Goal: Ability to remain free from injury will improve Outcome: Progressing

## 2023-06-09 NOTE — Progress Notes (Signed)
DISCHARGE NOTE HOME ALEYSIA PLOTNICK to be discharged Home per MD order. Discussed prescriptions and follow up appointments with the patient. Prescriptions given to patient; medication list explained in detail. Patient verbalized understanding.  Skin clean, dry and intact without evidence of skin break down, no evidence of skin tears noted. IV catheter discontinued intact. Site without signs and symptoms of complications. Dressing and pressure applied. Pt denies pain at the site currently. No complaints noted.  Patient free of lines, drains, and wounds.   An After Visit Summary (AVS) was printed and given to the patient. Patient escorted via wheelchair, and discharged home via private auto.  Lorine Bears, RN

## 2023-06-09 NOTE — Discharge Instructions (Signed)
Your blood pressure medicine atenolol-chlorthalidone was discontinued due to borderline low heart rate and blood pressure.  Please follow-up with PCP for further guidance and whether to restart this medication or not outpatient.  FURTHER DISCHARGE INSTRUCTIONS:   Get Medicines reviewed and adjusted: Please take all your medications with you for your next visit with your Primary MD   Laboratory/radiological data: Please request your Primary MD to go over all hospital tests and procedure/radiological results at the follow up, please ask your Primary MD to get all Hospital records sent to his/her office.   In some cases, they will be blood work, cultures and biopsy results pending at the time of your discharge. Please request that your primary care M.D. goes through all the records of your hospital data and follows up on these results.   Also Note the following: If you experience worsening of your admission symptoms, develop shortness of breath, life threatening emergency, suicidal or homicidal thoughts you must seek medical attention immediately by calling 911 or calling your MD immediately  if symptoms less severe.   You must read complete instructions/literature along with all the possible adverse reactions/side effects for all the Medicines you take and that have been prescribed to you. Take any new Medicines after you have completely understood and accpet all the possible adverse reactions/side effects.    Do not drive when taking Pain medications or sleeping medications (Benzodaizepines)   Do not take more than prescribed Pain, Sleep and Anxiety Medications. It is not advisable to combine anxiety,sleep and pain medications without talking with your primary care practitioner   Special Instructions: If you have smoked or chewed Tobacco  in the last 2 yrs please stop smoking, stop any regular Alcohol  and or any Recreational drug use.   Wear Seat belts while driving.   Please note: You were  cared for by a hospitalist during your hospital stay. Once you are discharged, your primary care physician will handle any further medical issues. Please note that NO REFILLS for any discharge medications will be authorized once you are discharged, as it is imperative that you return to your primary care physician (or establish a relationship with a primary care physician if you do not have one) for your post hospital discharge needs so that they can reassess your need for medications and monitor your lab values.

## 2023-06-09 NOTE — Progress Notes (Addendum)
Physical Therapy Treatment Patient Details Name: Andrea Gordon MRN: [id number] DOB: May 16, 1935 Today's Date: 06/09/2023   History of Present Illness Pt is an 88 y/o F presenting to ED on 8/25 wtih change in metnal status, low appetite, and n/v episode, found to be COVID+. CT head negative. Admitted for acute metabolic encephalopathy due to UTI and COVID. PMH includes HTN, HLD, DM2, anxiety, diverticulosis, depression, ACDF, bil carpal tunnel release, L TKA    PT Comments  Pt admitted with above diagnosis. Pt progressing.  Did c/o nausea and nursing made aware. Most likely close to baseline. Has support at home.  Agree with [facility] services to follow up.  Pt currently with functional limitations due to the deficits listed below (see PT Problem List). Pt will benefit from acute skilled PT to increase their independence and safety with mobility to allow discharge.       If plan is discharge home, recommend the following: A little help with walking and/or transfers;A little help with bathing/dressing/bathroom;Assist for transportation;Help with stairs or ramp for entrance;Assistance with cooking/housework   Can travel by private vehicle        Equipment Recommendations  None recommended by PT    Recommendations for Other Services       Precautions / Restrictions Precautions Precautions: Fall Precaution Comments: COVID+ Restrictions Weight Bearing Restrictions: No     Mobility  Bed Mobility Overal bed mobility: Needs Assistance Bed Mobility: Supine to Sit     Supine to sit: Min assist     General bed mobility comments: Neeeded a little assist to use pad to come to EOB.  Also incr time    Transfers Overall transfer level: Needs assistance Equipment used: Rolling walker (2 wheels) Transfers: Sit to/from Stand Sit to Stand: Min assist           General transfer comment: directional cues, Pt needed a little assist to rise.    Ambulation/Gait Ambulation/Gait assistance: Min  assist Gait Distance (Feet): 70 Feet Assistive device: Rolling walker (2 wheels) Gait Pattern/deviations: Step-through pattern, Drifts right/left, Leaning posteriorly, Trunk flexed Gait velocity: WNL Gait velocity interpretation: <1.31 ft/sec, indicative of household ambulator   General Gait Details: Pt needed cues to stand tall and stay close to RW.  At times, pt running into bed, chair,and doorframe on L side, pt able to self-correct with cues.  Obtained a normal size RW as pt had a bariatric walker on arrival.  Did laps in room per pt preference.   Stairs             Wheelchair Mobility     Tilt Bed    Modified Rankin (Stroke Patients Only)       Balance Overall balance assessment: Needs assistance Sitting-balance support: Bilateral upper extremity supported Sitting balance-Leahy Scale: Good Sitting balance - Comments: reaches outside BOS without LOB   Standing balance support: During functional activity, Reliant on assistive device for balance Standing balance-Leahy Scale: Fair Standing balance comment: static stands with external support                            Cognition Arousal: Alert Behavior During Therapy: Anxious, WFL for tasks assessed/performed Overall Cognitive Status: No family/caregiver present to determine baseline cognitive functioning                                 General Comments: pt needing increased cues, due  to cognition vs HOH possibly, difficulty following simple instructions for visual assessment, needs repetition of instructions during session.        Exercises      General Comments General comments (skin integrity, edema, etc.): VSS on RA      Pertinent Vitals/Pain Pain Assessment Pain Assessment: No/denies pain    Home Living                          Prior Function            PT Goals (current goals can now be found in the care plan section) Acute Rehab PT Goals Patient Stated  Goal: to return home Progress towards PT goals: Progressing toward goals    Frequency    Min 1X/week      PT Plan      Co-evaluation              AM-PAC PT "6 Clicks" Mobility   Outcome Measure  Help needed turning from your back to your side while in a flat bed without using bedrails?: None Help needed moving from lying on your back to sitting on the side of a flat bed without using bedrails?: A Little Help needed moving to and from a bed to a chair (including a wheelchair)?: A Little Help needed standing up from a chair using your arms (e.g., wheelchair or bedside chair)?: A Little Help needed to walk in hospital room?: A Little Help needed climbing 3-5 steps with a railing? : A Little 6 Click Score: 19    End of Session Equipment Utilized During Treatment: Gait belt Activity Tolerance: Patient tolerated treatment well Patient left: with call bell/phone within reach;in chair;with chair alarm set Nurse Communication: Mobility status PT Visit Diagnosis: Muscle weakness (generalized) (M62.81)     Time: 4098-1191 PT Time Calculation (min) (ACUTE ONLY): 22 min  Charges:    $Gait Training: 8-22 mins PT General Charges $$ ACUTE PT VISIT: 1 Visit                     Andrea Gordon,PT Acute Rehab Services [phone]    Andrea Gordon 06/09/2023, 1:58 PM

## 2023-06-09 NOTE — Plan of Care (Signed)
  Problem: Education: Goal: Knowledge of risk factors and measures for prevention of condition will improve Outcome: Progressing   

## 2023-06-09 NOTE — TOC Transition Note (Addendum)
Transition of Care [facility] - CM/SW Discharge Note   Patient Details  Name: AUNA VEREEN MRN: [id number] Date of Birth: June 04, 1935  [facility] CM/SW Contact:  Leone Haven, RN Phone Number: 06/09/2023, 11:48 AM   Clinical Narrative:    Patient is for dc today, she is set up with [facility], added HHRN and HHAIDE to [facility] orders.  Lucila Maine will transport her home.  Lucila Maine is working will be here between 5 and 6 pm. Sent a Place for [employer] home with patient in packet.     Barriers to Discharge: Continued Medical Work up   Patient Goals and CMS Choice      Discharge Placement                         Discharge Plan and Services Additional resources added to the After Visit Summary for   In-house Referral: NA Discharge Planning Services: CM Consult Post Acute Care Choice: Home Health          DME Arranged: N/A DME Agency: NA       HH Arranged: PT HH Agency: [facility] Date [facility] Agency Contacted: 06/08/23 Time HH Agency Contacted: [phone] Representative spoke with at [facility] Agency: Kandee Keen  Social Determinants of Health (SDOH) Interventions SDOH Screenings   Tobacco Use: Low Risk  (04/08/2023)   Received from [facility]     Readmission Risk Interventions     No data to display

## 2023-06-11 LAB — CULTURE, BLOOD (ROUTINE X 2)
Culture: NO GROWTH
Special Requests: ADEQUATE
